# Patient Record
Sex: Male | Born: 1970 | Race: Black or African American | Hispanic: No | Marital: Single | Smoking: Former smoker
Health system: Southern US, Community
[De-identification: ages and names within clinical notes are randomized; demographics above are authoritative.]

## PROBLEM LIST (undated history)

## (undated) DIAGNOSIS — E119 Type 2 diabetes mellitus without complications: Secondary | ICD-10-CM

## (undated) DIAGNOSIS — L0291 Cutaneous abscess, unspecified: Secondary | ICD-10-CM

## (undated) DIAGNOSIS — K219 Gastro-esophageal reflux disease without esophagitis: Secondary | ICD-10-CM

## (undated) DIAGNOSIS — F329 Major depressive disorder, single episode, unspecified: Secondary | ICD-10-CM

## (undated) DIAGNOSIS — F32A Depression, unspecified: Secondary | ICD-10-CM

## (undated) HISTORY — DX: Type 2 diabetes mellitus without complications: E11.9

## (undated) HISTORY — DX: Depression, unspecified: F32.A

## (undated) HISTORY — PX: INCISE AND DRAIN ABCESS: PRO64

---
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
## 2012-02-15 ENCOUNTER — Ambulatory Visit (INDEPENDENT_AMBULATORY_CARE_PROVIDER_SITE_OTHER): Payer: Self-pay | Admitting: Family Medicine

## 2012-02-15 ENCOUNTER — Encounter: Payer: Self-pay | Admitting: Family Medicine

## 2012-02-15 VITALS — BP 132/76 | HR 92 | Temp 97.5°F | Resp 16 | Ht 69.5 in | Wt 252.0 lb

## 2012-02-15 DIAGNOSIS — Z0289 Encounter for other administrative examinations: Secondary | ICD-10-CM

## 2012-02-15 DIAGNOSIS — Z0000 Encounter for general adult medical examination without abnormal findings: Secondary | ICD-10-CM

## 2012-02-15 LAB — POCT URINALYSIS DIPSTICK: Glucose, UA: 0

## 2012-02-15 NOTE — Progress Notes (Signed)
Subjective:    Patient ID: Steven Tanner, male    DOB: 1971-01-04, 41 y.o.   MRN: [id number] Chief Complaint  Patient presents with  . Annual Exam    DOT    HPI  Smokes intermittently - 1 ppd for about half the time of 17 yrs. Does have occ back pain for which he has seen a doctor - told he has degenerative disc disease.  No past medical history on file. No past surgical history on file. Mr. Andujo does not currently have medications on file. No family history on file. History  Substance Use Topics  . Smoking status: Current Every Day Smoker -- 1.0 packs/day    Types: Cigarettes, Cigars    Start date: 02/15/1995  . Smokeless tobacco: Never Used  . Alcohol Use: No   Allergies  Allergen Reactions  . Codeine       Review of Systems  All other systems reviewed and are negative.      BP 132/76  Pulse 92  Temp 97.5 F (36.4 C)  Resp 16  Ht 5' 9.5" (1.765 m)  Wt 252 lb (114.306 kg)  BMI 36.68 kg/m2 Objective:   Physical Exam  Constitutional: He is oriented to person, place, and time. He appears well-developed and well-nourished. No distress.  HENT:  Head: Normocephalic and atraumatic.  Right Ear: Tympanic membrane, external ear and ear canal normal.  Left Ear: Tympanic membrane, external ear and ear canal normal.  Nose: Nose normal.  Mouth/Throat: Uvula is midline, oropharynx is clear and moist and mucous membranes are normal. No oropharyngeal exudate.  Eyes: Conjunctivae are normal. Right eye exhibits no discharge. Left eye exhibits no discharge. No scleral icterus.  Neck: Normal range of motion and full passive range of motion without pain. Neck supple. No thyromegaly present.  Cardiovascular: Normal rate, regular rhythm, normal heart sounds and intact distal pulses.   Pulmonary/Chest: Effort normal and breath sounds normal. No respiratory distress.  Abdominal: Soft. Bowel sounds are normal. He exhibits no distension and no mass. There is no tenderness. There is  no rebound and no guarding. Hernia confirmed negative in the right inguinal area and confirmed negative in the left inguinal area.  Musculoskeletal: He exhibits no edema.       Right shoulder: Normal.       Left shoulder: Normal.       Cervical back: Normal.       Thoracic back: Normal.       Lumbar back: Normal.  Lymphadenopathy:    He has no cervical adenopathy.  Neurological: He is alert and oriented to person, place, and time. He has normal reflexes. No cranial nerve deficit. He exhibits normal muscle tone.  Skin: Skin is warm and dry. No rash noted. He is not diaphoretic. No erythema.  Psychiatric: He has a normal mood and affect. His behavior is normal.      Results for orders placed in visit on 02/15/12  POCT URINALYSIS DIPSTICK      Result Value Range   Color, UA       Clarity, UA       Glucose, UA 0     Bilirubin, UA       Ketones, UA       Spec Grav, UA >=1.030     Blood, UA 0     pH, UA       Protein, UA 100     Urobilinogen, UA       Nitrite, UA  Leukocytes, UA        Assessment & Plan:  DOT exam - No restrictions or concerns. 2 yr card given  Annual physical exam - Plan: POCT urinalysis dipstick  No orders of the defined types were placed in this encounter.

## 2013-09-10 ENCOUNTER — Emergency Department (HOSPITAL_COMMUNITY)
Admission: EM | Admit: 2013-09-10 | Discharge: 2013-09-10 | Disposition: A | Discharge status: Home / Self Care | Payer: 59 | Attending: Emergency Medicine | Admitting: Emergency Medicine

## 2013-09-10 ENCOUNTER — Encounter (HOSPITAL_COMMUNITY): Payer: Self-pay | Admitting: Emergency Medicine

## 2013-09-10 DIAGNOSIS — L0291 Cutaneous abscess, unspecified: Secondary | ICD-10-CM

## 2013-09-10 DIAGNOSIS — L03317 Cellulitis of buttock: Principal | ICD-10-CM

## 2013-09-10 DIAGNOSIS — Z79899 Other long term (current) drug therapy: Secondary | ICD-10-CM | POA: Insufficient documentation

## 2013-09-10 DIAGNOSIS — F17200 Nicotine dependence, unspecified, uncomplicated: Secondary | ICD-10-CM | POA: Insufficient documentation

## 2013-09-10 DIAGNOSIS — L0231 Cutaneous abscess of buttock: Secondary | ICD-10-CM | POA: Insufficient documentation

## 2013-09-10 HISTORY — DX: Cutaneous abscess, unspecified: L02.91

## 2013-09-10 MED ORDER — SULFAMETHOXAZOLE-TRIMETHOPRIM 800-160 MG PO TABS: 2.0000 | ORAL_TABLET | Freq: Two times a day (BID) | ORAL | Status: DC

## 2013-09-10 MED ORDER — HYDROCODONE-ACETAMINOPHEN 5-325 MG PO TABS: 1.0000 | ORAL_TABLET | ORAL | Status: DC | PRN

## 2013-09-10 NOTE — Discharge Instructions (Signed)
Abscess An abscess is an infected area that contains a collection of pus and debris.It can occur in almost any part of the body. An abscess is also known as a furuncle or boil. CAUSES  An abscess occurs when tissue gets infected. This can occur from blockage of oil or sweat glands, infection of hair follicles, or a minor injury to the skin. As the body tries to fight the infection, pus collects in the area and creates pressure under the skin. This pressure causes pain. People with weakened immune systems have difficulty fighting infections and get certain abscesses more often.  SYMPTOMS Usually an abscess develops on the skin and becomes a painful mass that is red, warm, and tender. If the abscess forms under the skin, you may feel a moveable soft area under the skin. Some abscesses break open (rupture) on their own, but most will continue to get worse without care. The infection can spread deeper into the body and eventually into the bloodstream, causing you to feel ill.  DIAGNOSIS  Your caregiver will take your medical history and perform a physical exam. A sample of fluid may also be taken from the abscess to determine what is causing your infection. TREATMENT  Your caregiver may prescribe antibiotic medicines to fight the infection. However, taking antibiotics alone usually does not cure an abscess. Your caregiver may need to make a small cut (incision) in the abscess to drain the pus. In some cases, gauze is packed into the abscess to reduce pain and to continue draining the area. HOME CARE INSTRUCTIONS   Only take over-the-counter or prescription medicines for pain, discomfort, or fever as directed by your caregiver.  If you were prescribed antibiotics, take them as directed. Finish them even if you start to feel better.  If gauze is used, follow your caregiver's directions for changing the gauze.  To avoid spreading the infection:  Keep your draining abscess covered with a  bandage.  Wash your hands well.  Do not share personal care items, towels, or whirlpools with others.  Avoid skin contact with others.  Keep your skin and clothes clean around the abscess.  Keep all follow-up appointments as directed by your caregiver. SEEK MEDICAL CARE IF:   You have increased pain, swelling, redness, fluid drainage, or bleeding.  You have muscle aches, chills, or a general ill feeling.  You have a fever. MAKE SURE YOU:   Understand these instructions.  Will watch your condition.  Will get help right away if you are not doing well or get worse. Document Released: 12/13/2004 Document Revised: 09/04/2011 Document Reviewed: 05/18/2011 [facility] Patient Information 2015 [place], [place]. This information is not intended to replace advice given to you by your health care provider. Make sure you discuss any questions you have with your health care provider.   Continue with your warm tub soaks  - 15 minutes 3 times daily.  Avoid squeezing these sites which can drive the infection deeper.  Return here if soaks and antibiotics are not improving this, as discussed.    You may also benefit from seeing a surgeon if you continue to have recurring problems with abscesses.  You have been given a referral for this.

## 2013-09-10 NOTE — ED Notes (Signed)
Pt requesting to be checked for [facility] spotted fever, states vomiting off and on for past month and lasted vomited 2 weeks ago, states that he has removed 6 attached ticks in the last month as well

## 2013-09-10 NOTE — ED Notes (Signed)
J Idol in with pt

## 2013-09-10 NOTE — ED Notes (Signed)
Pt has 3 rectal abscesses, Has hx of pilonidal cyst.  Has removed ticks recently .  Vomiting over past month.

## 2013-09-10 NOTE — ED Notes (Signed)
Pt with abscess to sacrum for a month, hx of same

## 2013-09-14 NOTE — ED Provider Notes (Signed)
CSN: [id number]     Arrival date & time 09/10/13  1126 History   First MD Initiated Contact with Patient 09/10/13 1151     Chief Complaint  Patient presents with  . Abscess     (Consider location/radiation/quality/duration/timing/severity/associated sxs/prior Treatment) Patient is a 43 y.o. male presenting with abscess. The history is provided by the patient and the spouse.  Abscess Location:  Ano-genital Ano-genital abscess location:  R buttock Size:  1 cm Abscess quality: induration and painful   Abscess quality: not draining and no redness   Red streaking: no   Duration:  1 week (It returned one month ago,  drained, now has returned again) Progression:  Worsening Pain details:    Quality:  Pressure   Severity:  Moderate   Timing:  Constant   Progression:  Worsening Chronicity:  Recurrent Context: not diabetes, not immunosuppression and not injected drug use   Relieved by:  Nothing Worsened by:  Nothing tried Ineffective treatments:  Warm compresses Associated symptoms: no fever, no nausea and no vomiting   Risk factors: prior abscess     Past Medical History  Diagnosis Date  . Abscess    Past Surgical History  Procedure Laterality Date  . Incise and drain abcess     Family History  Problem Relation Age of Onset  . Diabetes Father    History  Substance Use Topics  . Smoking status: Current Every Day Smoker -- 1.00 packs/day    Types: Cigarettes, Cigars    Start date: 02/15/1995  . Smokeless tobacco: Never Used  . Alcohol Use: Yes     Comment: rare    Review of Systems  Constitutional: Negative for fever and chills.  Respiratory: Negative for shortness of breath and wheezing.   Gastrointestinal: Negative for nausea and vomiting.  Skin: Positive for wound.  Neurological: Negative for numbness.      Allergies  Codeine  Home Medications   Prior to Admission medications   Medication Sig Start Date End Date Taking? Authorizing Mareo Portilla   HYDROcodone-acetaminophen (NORCO/VICODIN) 5-325 MG per tablet Take 1 tablet by mouth every 4 (four) hours as needed. 09/10/13   Evalee Jefferson, PA-C  sulfamethoxazole-trimethoprim (SEPTRA DS) 800-160 MG per tablet Take 2 tablets by mouth every 12 (twelve) hours. 09/10/13   Evalee Jefferson, PA-C   BP 131/90  Pulse 101  Temp(Src) 97.5 F (36.4 C) (Oral)  Resp 20  Ht 5' 10.5" (1.791 m)  Wt 260 lb (117.935 kg)  BMI 36.77 kg/m2  SpO2 99% Physical Exam  Constitutional: He appears well-developed and well-nourished. No distress.  HENT:  Head: Normocephalic.  Neck: Neck supple.  Cardiovascular: Normal rate.   Pulmonary/Chest: Effort normal. He has no wheezes.  Musculoskeletal: Normal range of motion. He exhibits no edema.  Skin:  Small indurated abscess, 1 cm without fluctuance or surrounding erythema right medial upper buttock.    ED Course  Procedures (including critical care time) Labs Review Labs Reviewed - No data to display  Imaging Review No results found.   EKG Interpretation None      MDM   Final diagnoses:  Abscess    Discussed I & D vs warm soaks, abx.  Pt opts for trial of abx and warm soaks first.  Advised pt if this does not resolve or if it worsens, may need I & D which he understands.  He was given a referral to gen surgery since this is recurrent at the pilonidal space.  May benefit from more aggressive surgical excision.  Pt understands plan.  The patient appears reasonably screened and/or stabilized for discharge and I doubt any other medical condition or other [facility] [place] [facility] requiring further screening, evaluation, or treatment in the ED at this time prior to discharge.     Evalee Jefferson, PA-C 09/14/13 1426

## 2013-09-19 NOTE — ED Provider Notes (Signed)
Medical screening examination/treatment/procedure(s) were performed by non-physician practitioner and as supervising physician I was immediately available for consultation/collaboration.   EKG Interpretation None        Maudry Diego, MD 09/19/13 [phone]

## 2013-09-24 ENCOUNTER — Emergency Department (HOSPITAL_COMMUNITY)
Admission: EM | Admit: 2013-09-24 | Discharge: 2013-09-24 | Disposition: A | Discharge status: Home / Self Care | Payer: 59 | Attending: Emergency Medicine | Admitting: Emergency Medicine

## 2013-09-24 ENCOUNTER — Encounter (HOSPITAL_COMMUNITY): Payer: Self-pay | Admitting: Emergency Medicine

## 2013-09-24 DIAGNOSIS — L270 Generalized skin eruption due to drugs and medicaments taken internally: Secondary | ICD-10-CM | POA: Insufficient documentation

## 2013-09-24 DIAGNOSIS — F17200 Nicotine dependence, unspecified, uncomplicated: Secondary | ICD-10-CM | POA: Insufficient documentation

## 2013-09-24 DIAGNOSIS — IMO0002 Reserved for concepts with insufficient information to code with codable children: Secondary | ICD-10-CM | POA: Insufficient documentation

## 2013-09-24 MED ORDER — DIPHENHYDRAMINE HCL 50 MG/ML IJ SOLN
50.0000 mg | Freq: Once | INTRAMUSCULAR | Status: AC
  Administered 2013-09-24: 50 mg via INTRAMUSCULAR
  Filled 2013-09-24: qty 1

## 2013-09-24 MED ORDER — PREDNISONE 10 MG PO TABS: ORAL_TABLET | ORAL | Status: DC

## 2013-09-24 MED ORDER — PREDNISONE 50 MG PO TABS
60.0000 mg | ORAL_TABLET | Freq: Once | ORAL | Status: AC
  Administered 2013-09-24: 60 mg via ORAL
  Filled 2013-09-24 (×2): qty 1

## 2013-09-24 MED ORDER — DIPHENHYDRAMINE HCL 25 MG PO TABS: 25.0000 mg | ORAL_TABLET | Freq: Four times a day (QID) | ORAL | Status: DC | PRN

## 2013-09-24 NOTE — ED Notes (Signed)
Pt states he woke up with rash on legs and arms and back, denies having any know contact with any allergens.

## 2013-09-24 NOTE — Discharge Instructions (Signed)
Drug Allergy Allergic reactions to medicines are common. Some allergic reactions are mild. A delayed type of drug allergy that occurs 1 week or more after exposure to a medicine or vaccine is called serum sickness. A life-threatening, sudden (acute) allergic reaction that involves the whole body is called anaphylaxis. CAUSES  "True" drug allergies occur when there is an allergic reaction to a medicine. This is caused by overactivity of the immune system. First, the body becomes sensitized. The immune system is triggered by your first exposure to the medicine. Following this first exposure, future exposure to the same medicine may be life-threatening. Almost any medicine can cause an allergic reaction. Common ones are:  Penicillin.  Sulfonamides (sulfa drugs).  This is the one you have been taking.  Local anesthetics.  X-ray dyes that contain iodine. SYMPTOMS  Common symptoms of a minor allergic reaction are:  Swelling around the mouth.  An itchy red rash or hives.  Vomiting or diarrhea. Anaphylaxis can cause swelling of the mouth and throat. This makes it difficult to breathe and swallow. Severe reactions can be fatal within seconds, even after exposure to only a trace amount of the drug that causes the reaction. HOME CARE INSTRUCTIONS   If you are unsure of what caused your reaction, keep a diary of foods and medicines used. Include the symptoms that followed. Avoid anything that causes reactions.  You may want to follow up with an allergy specialist after the reaction has cleared in order to be tested to confirm the allergy. It is important to confirm that your reaction is an allergy, not just a side effect to the medicine. If you have a true allergy to a medicine, this may prevent that medicine and related medicines from being given to you when you are very ill.  If you have hives or a rash:  Take medicines as directed by your caregiver.  You may use an over-the-counter  antihistamine (diphenhydramine) as needed.  Apply cold compresses to the skin or take baths in cool water. Avoid hot baths or showers.  If you are severely allergic:  Continuous observation after a severe reaction may be needed. Hospitalization is often required.  Wear a medical alert bracelet or necklace stating your allergy.  You and your family must learn how to use an anaphylaxis kit or give an epinephrine injection to temporarily treat an emergency allergic reaction. If you have had a severe reaction, always carry your epinephrine injection or anaphylaxis kit with you. This can be lifesaving if you have a severe reaction.  Do not drive or perform tasks after treatment until the medicines used to treat your reaction have worn off, or until your caregiver says it is okay. SEEK MEDICAL CARE IF:   You think you had an allergic reaction. Symptoms usually start within 30 minutes after exposure.  Symptoms are getting worse rather than better.  You develop new symptoms.  The symptoms that brought you to your caregiver return. SEEK IMMEDIATE MEDICAL CARE IF:   You have swelling of the mouth, difficulty breathing, or wheezing.  You have a tight feeling in your chest or throat.  You develop swelling all over your body, or your mouth, lips, tongue or throat.  You develop severe vomiting or diarrhea.  You feel faint or pass out. This is an emergency.  Call for emergency medical help. MAKE SURE YOU:   Understand these instructions.  Will watch your condition.  Will get help right away if you are not doing well or  get worse. Document Released: 03/05/2005 Document Revised: 05/28/2011 Document Reviewed: 08/09/2010 [facility] Patient Information 2015 [place], [place]. This information is not intended to replace advice given to you by your health care provider. Make sure you discuss any questions you have with your health care provider.   Stop taking your sulfa antibiotic (septra). Take  your next dose of prednisone tomorrow, you may take the entire dose once daily, make sure you take with a snack or food.   Continue using benadryl every 6 hours if you are itching.  Cool compresses or tub soak can help with itching. Hot water/shower will flare your itching.  You should avoid taking any medicines which contain sulfa as I suspect you are allergic to this class of medicine.  This allergy has been added to your medical chart with this hospital, but you should let any other providers know of this reaction.

## 2013-09-26 NOTE — ED Provider Notes (Signed)
CSN: [id number]     Arrival date & time 09/24/13  1507 History   First MD Initiated Contact with Patient 09/24/13 1559     Chief Complaint  Patient presents with  . Rash     (Consider location/radiation/quality/duration/timing/severity/associated sxs/prior Treatment) The history is provided by the patient and the spouse.    Steven Tanner is a 43 y.o. male presenting with a diffuse, itchy rash which started 3 days ago.  He is currently being treated for an abscess on his buttocks which is improved with bactrim.  He does not recognize any other new exposures.  He has been on this medicine since 6/29, but states he misread the instructions and was taking 1 tab bid instead of 2 tabs bid and he increased this dosing Sunday evening, the day before his rash started.  He denies fevers or chills and states his abscess site is much improved.  He continues to complete warm soaks but denies any further drainage from the site.  He has no sob, wheezing, coughing.  His rash is not tender, but simply very itchy without drainage.     Past Medical History  Diagnosis Date  . Abscess    Past Surgical History  Procedure Laterality Date  . Incise and drain abcess     Family History  Problem Relation Age of Onset  . Diabetes Father    History  Substance Use Topics  . Smoking status: Current Every Day Smoker -- 1.00 packs/day    Types: Cigarettes, Cigars    Start date: 02/15/1995  . Smokeless tobacco: Never Used  . Alcohol Use: Yes     Comment: rare    Review of Systems  Constitutional: Negative for fever and chills.  Respiratory: Negative for shortness of breath and wheezing.   Skin: Positive for rash.  Neurological: Negative for numbness.      Allergies  Codeine and Sulfa antibiotics  Home Medications   Prior to Admission medications   Medication Sig Start Date End Date Taking? Authorizing Provider  HYDROcodone-acetaminophen (NORCO/VICODIN) 5-325 MG per tablet Take 1 tablet by  mouth every 4 (four) hours as needed. 09/10/13  Yes Almyra Free Master Touchet, PA-C  sulfamethoxazole-trimethoprim (SEPTRA DS) 800-160 MG per tablet Take 2 tablets by mouth every 12 (twelve) hours. 09/10/13  Yes Evalee Jefferson, PA-C  diphenhydrAMINE (BENADRYL) 25 MG tablet Take 1 tablet (25 mg total) by mouth every 6 (six) hours as needed for itching. 09/24/13   Evalee Jefferson, PA-C  predniSONE (DELTASONE) 10 MG tablet Take 6 tabs daily by mouth for 1 day,  Then 5 tabs daily for 2 days,  4 tabs daily for 2 days,  3 tabs daily for 2 days,  2 tabs daily for 2 days,  Then 1 tab daily for 2 days. 09/24/13   Evalee Jefferson, PA-C   BP 126/74  Pulse 95  Temp(Src) 97.8 F (36.6 C) (Oral)  Resp 16  Ht 5\' 10"  (1.778 m)  Wt 250 lb (113.399 kg)  BMI 35.87 kg/m2  SpO2 100% Physical Exam  Constitutional: He appears well-developed and well-nourished. No distress.  HENT:  Head: Normocephalic.  Neck: Neck supple.  Cardiovascular: Normal rate.   Pulmonary/Chest: Effort normal. He has no wheezes.  Musculoskeletal: Normal range of motion. He exhibits no edema.  Skin: Rash noted. Rash is maculopapular. Rash is not pustular and not vesicular.  Diffuse fine rash with excoriations.  No drainage, no hives.     ED Course  Procedures (including critical care time) Labs Review Labs  Reviewed - No data to display  Imaging Review No results found.   EKG Interpretation None      MDM   Final diagnoses:  Drug rash    Pt advised to stop bactrim,  Has been on this med now for 10 days, abscess improved, continue warm soaks until completely resolved.  Given prednisone taper, benadryl.  Prn f/u.  Advised to avoid sulfa meds in future.  The patient appears reasonably screened and/or stabilized for discharge and I doubt any other medical condition or other [facility] requiring further screening, evaluation, or treatment in the ED at this time prior to discharge.     Evalee Jefferson, PA-C 09/26/13 [phone]

## 2013-09-28 NOTE — ED Provider Notes (Signed)
Medical screening examination/treatment/procedure(s) were performed by non-physician practitioner and as supervising physician I was immediately available for consultation/collaboration.  Leota Jacobsen, MD 09/28/13 1055

## 2013-10-29 ENCOUNTER — Encounter (HOSPITAL_COMMUNITY): Payer: Self-pay | Admitting: Pharmacy Technician

## 2013-10-29 NOTE — Patient Instructions (Signed)
JEMAINE PROKOP  10/29/2013   Your procedure is scheduled on:   11/02/2013  Report to [facility] at  77  AM.  Call this number if you have problems the morning of surgery: [phone]   Remember:   Do not eat food or drink liquids after midnight.   Take these medicines the morning of surgery with A SIP OF WATER: hydrocodone   Do not wear jewelry, make-up or nail polish.  Do not wear lotions, powders, or perfumes.   Do not shave 48 hours prior to surgery. Men may shave face and neck.  Do not bring valuables to the hospital.  [facility] is not responsible for any belongings or valuables.               Contacts, dentures or bridgework may not be worn into surgery.  Leave suitcase in the car. After surgery it may be brought to your room.  For patients admitted to the hospital, discharge time is determined by your treatment team.               Patients discharged the day of surgery will not be allowed to drive home.  Name and phone number of your driver: family  Special Instructions: Shower using CHG 2 nights before surgery and the night before surgery.  If you shower the day of surgery use CHG.  Use special wash - you have one bottle of CHG for all showers.  You should use approximately 1/3 of the bottle for each shower.   Please read over the following fact sheets that you were given: Pain Booklet, Coughing and Deep Breathing, Surgical Site Infection Prevention, Anesthesia Post-op Instructions and Care and Recovery After Surgery Pilonidal Cyst A pilonidal cyst occurs when hairs get trapped (ingrown) beneath the skin in the crease between the buttocks over your sacrum (the bone under that crease). Pilonidal cysts are most common in young men with a lot of body hair. When the cyst is ruptured (breaks) or leaking, fluid from the cyst may cause burning and itching. If the cyst becomes infected, it causes a painful swelling filled with pus (abscess). The pus and trapped hairs need to be  removed (often by lancing) so that the infection can heal. However, recurrence is common and an operation may be needed to remove the cyst. HOME CARE INSTRUCTIONS   If the cyst was NOT INFECTED:  Keep the area clean and dry. Bathe or shower daily. Wash the area well with a germ-killing soap. Warm tub baths may help prevent infection and help with drainage. Dry the area well with a towel.  Avoid tight clothing to keep area as moisture free as possible.  Keep area between buttocks as free of hair as possible. A depilatory may be used.  If the cyst WAS INFECTED and needed to be drained:  Your caregiver packed the wound with gauze to keep the wound open. This allows the wound to heal from the inside outwards and continue draining.  Return for a wound check in 1 day or as suggested.  If you take tub baths or showers, repack the wound with gauze following them. Sponge baths (at the sink) are a good alternative.  If an antibiotic was ordered to fight the infection, take as directed.  Only take over-the-counter or prescription medicines for pain, discomfort, or fever as directed by your caregiver.  After the drain is removed, use sitz baths for 20 minutes 4 times per day. Clean the  wound gently with mild unscented soap, pat dry, and then apply a dry dressing. SEEK MEDICAL CARE IF:   You have increased pain, swelling, redness, drainage, or bleeding from the area.  You have a fever.  You have muscles aches, dizziness, or a general ill feeling. Document Released: 03/02/2000 Document Revised: 05/28/2011 Document Reviewed: 04/30/2008 [facility] Patient Information 2015 [place], [place]. This information is not intended to replace advice given to you by your health care provider. Make sure you discuss any questions you have with your health care provider. PATIENT INSTRUCTIONS POST-ANESTHESIA  IMMEDIATELY FOLLOWING SURGERY:  Do not drive or operate machinery for the first twenty four hours after  surgery.  Do not make any important decisions for twenty four hours after surgery or while taking narcotic pain medications or sedatives.  If you develop intractable nausea and vomiting or a severe headache please notify your doctor immediately.  FOLLOW-UP:  Please make an appointment with your surgeon as instructed. You do not need to follow up with anesthesia unless specifically instructed to do so.  WOUND CARE INSTRUCTIONS (if applicable):  Keep a dry clean dressing on the anesthesia/puncture wound site if there is drainage.  Once the wound has quit draining you may leave it open to air.  Generally you should leave the bandage intact for twenty four hours unless there is drainage.  If the epidural site drains for more than 36-48 hours please call the anesthesia department.  QUESTIONS?:  Please feel free to call your physician or the hospital operator if you have any questions, and they will be happy to assist you.

## 2013-10-30 ENCOUNTER — Encounter (HOSPITAL_COMMUNITY): Payer: Self-pay

## 2013-10-30 ENCOUNTER — Encounter (HOSPITAL_COMMUNITY)
Admission: RE | Admit: 2013-10-30 | Discharge: 2013-10-30 | Disposition: A | Discharge status: Home / Self Care | Payer: 59 | Source: Ambulatory Visit | Attending: General Surgery | Admitting: General Surgery

## 2013-10-30 DIAGNOSIS — L0591 Pilonidal cyst without abscess: Secondary | ICD-10-CM | POA: Diagnosis present

## 2013-10-30 DIAGNOSIS — K219 Gastro-esophageal reflux disease without esophagitis: Secondary | ICD-10-CM | POA: Diagnosis not present

## 2013-10-30 DIAGNOSIS — F17200 Nicotine dependence, unspecified, uncomplicated: Secondary | ICD-10-CM | POA: Diagnosis not present

## 2013-10-30 HISTORY — DX: Gastro-esophageal reflux disease without esophagitis: K21.9

## 2013-10-30 LAB — BASIC METABOLIC PANEL
Anion gap: 13 (ref 5–15)
BUN: 14 mg/dL (ref 6–23)
CHLORIDE: 101 meq/L (ref 96–112)
CO2: 26 mEq/L (ref 19–32)
Calcium: 10 mg/dL (ref 8.4–10.5)
Creatinine, Ser: 1.29 mg/dL (ref 0.50–1.35)
GFR calc non Af Amer: 67 mL/min — ABNORMAL LOW (ref >90)
GFR, EST AFRICAN AMERICAN: 78 mL/min — AB (ref >90)
GLUCOSE: 123 mg/dL — AB (ref 70–99)
POTASSIUM: 5.1 meq/L (ref 3.7–5.3)
Sodium: 140 mEq/L (ref 137–147)

## 2013-10-30 LAB — CBC WITH DIFFERENTIAL/PLATELET
Basophils Absolute: 0 10*3/uL (ref 0.0–0.1)
Basophils Relative: 0 % (ref 0–1)
EOS ABS: 0.2 10*3/uL (ref 0.0–0.7)
Eosinophils Relative: 2 % (ref 0–5)
HCT: 45.9 % (ref 39.0–52.0)
HEMOGLOBIN: 15.4 g/dL (ref 13.0–17.0)
LYMPHS ABS: 2.6 10*3/uL (ref 0.7–4.0)
LYMPHS PCT: 35 % (ref 12–46)
MCH: 28.9 pg (ref 26.0–34.0)
MCHC: 33.6 g/dL (ref 30.0–36.0)
MCV: 86.3 fL (ref 78.0–100.0)
MONOS PCT: 9 % (ref 3–12)
Monocytes Absolute: 0.7 10*3/uL (ref 0.1–1.0)
NEUTROS PCT: 54 % (ref 43–77)
Neutro Abs: 4 10*3/uL (ref 1.7–7.7)
Platelets: 301 10*3/uL (ref 150–400)
RBC: 5.32 MIL/uL (ref 4.22–5.81)
RDW: 14.2 % (ref 11.5–15.5)
WBC: 7.5 10*3/uL (ref 4.0–10.5)

## 2013-10-30 NOTE — Pre-Procedure Instructions (Signed)
Patient given information to sign up for my chart at home. 

## 2013-10-30 NOTE — H&P (Signed)
  NTS SOAP Note  Vital Signs:  Vitals as of: 5/88/5027: Systolic 741: Diastolic 91: Heart Rate 87: Temp 40F: Height 61ft 10.56in: Weight 255Lbs 0 Ounces: Pain Level 4: BMI 36.01  BMI :   Subjective: This 43 Years 2 Months old Male presents for of a recurrent pilonidal cyst.  Has had recurrent swelling in the pilonidal area for some time now.  Has been i and d in the past, never formally excised.  Last episode two weeks ago, has since improved.  Review of Symptoms:  Constitutional:unremarkable   Head:unremarkable    Eyes:unremarkable   Nose/Mouth/Throat:unremarkable Cardiovascular:  unremarkable   Respiratory:unremarkable   Gastrointestinal:  unremarkable   Genitourinary:unremarkable     Musculoskeletal:unremarkable   Hematolgic/Lymphatic:unremarkable     Allergic/Immunologic:unremarkable     Past Medical History:    Reviewed  Past Medical History  Surgical History: unremarkable Medical Problems: none Allergies: nkda Medications: none   Social History:Reviewed  Social History  Preferred Language: English Race:  Black or African American Ethnicity: Not Hispanic / Latino Age: 43 Years 2 Months Marital Status:  S Alcohol: no   Smoking Status: Current every day smoker reviewed on 10/29/2013 Started Date:  Packs per week:  Functional Status reviewed on 10/29/2013 ------------------------------------------------ Bathing: Normal Cooking: Normal Dressing: Normal Driving: Normal Eating: Normal Managing Meds: Normal Oral Care: Normal Shopping: Normal Toileting: Normal Transferring: Normal Walking: Normal Cognitive Status reviewed on 10/29/2013 ------------------------------------------------ Attention: Normal Decision Making: Normal Language: Normal Memory: Normal Motor: Normal Perception: Normal Problem Solving: Normal Visual and Spatial: Normal   Family History:  Reviewed  Family Health History Mother, Living;  Healthy;  Father, Living; Diabetes mellitus, unspecified type;     Objective Information: General:  Well appearing, well nourished in no distress.      Pilonidal cyst present, along with an area of induration just to the left of the pilonidal region. Heart:  RRR, no murmur Lungs:    CTA bilaterally, no wheezes, rhonchi, rales.  Breathing unlabored.  Assessment:Pilonidal cyst  Diagnoses: 287.1 Cyst - pilonidal (Pilonidal cyst without abscess)  Procedures: 86767 - OFFICE OUTPATIENT NEW 30 MINUTES    Plan:  Scheduled for pilonidal cyst excision on 11/02/13.   Patient Education:Alternative treatments to surgery were discussed with patient (and family).  Risks and benefits  of procedure including bleeding, infection, and recurrence of the cyst were fully explained to the patient (and family) who gave informed consent. Patient/family questions were addressed.  Follow-up:Pending Surgery

## 2013-11-02 ENCOUNTER — Ambulatory Visit (HOSPITAL_COMMUNITY)
Admission: RE | Admit: 2013-11-02 | Discharge: 2013-11-02 | Disposition: A | Discharge status: Home / Self Care | Payer: 59 | Source: Ambulatory Visit | Attending: General Surgery | Admitting: General Surgery

## 2013-11-02 ENCOUNTER — Encounter (HOSPITAL_COMMUNITY): Payer: 59 | Admitting: Anesthesiology

## 2013-11-02 ENCOUNTER — Encounter (HOSPITAL_COMMUNITY)
Admission: RE | Disposition: A | Discharge status: Home / Self Care | Payer: Self-pay | Source: Ambulatory Visit | Attending: General Surgery

## 2013-11-02 ENCOUNTER — Ambulatory Visit (HOSPITAL_COMMUNITY): Payer: 59 | Admitting: Anesthesiology

## 2013-11-02 ENCOUNTER — Encounter (HOSPITAL_COMMUNITY): Payer: Self-pay | Admitting: *Deleted

## 2013-11-02 DIAGNOSIS — K219 Gastro-esophageal reflux disease without esophagitis: Secondary | ICD-10-CM | POA: Insufficient documentation

## 2013-11-02 DIAGNOSIS — L0591 Pilonidal cyst without abscess: Secondary | ICD-10-CM | POA: Insufficient documentation

## 2013-11-02 DIAGNOSIS — F17200 Nicotine dependence, unspecified, uncomplicated: Secondary | ICD-10-CM | POA: Insufficient documentation

## 2013-11-02 HISTORY — PX: PILONIDAL CYST EXCISION: SHX744

## 2013-11-02 SURGERY — EXCISION, SIMPLE PILONIDAL CYST
Anesthesia: General

## 2013-11-02 MED ORDER — DEXAMETHASONE SODIUM PHOSPHATE 4 MG/ML IJ SOLN
INTRAMUSCULAR | Status: AC
  Filled 2013-11-02: qty 1

## 2013-11-02 MED ORDER — GLYCOPYRROLATE 0.2 MG/ML IJ SOLN
0.2000 mg | Freq: Once | INTRAMUSCULAR | Status: AC
  Administered 2013-11-02: 0.2 mg via INTRAVENOUS

## 2013-11-02 MED ORDER — CEFAZOLIN SODIUM-DEXTROSE 2-3 GM-% IV SOLR
2.0000 g | INTRAVENOUS | Status: AC
  Administered 2013-11-02: 2 g via INTRAVENOUS

## 2013-11-02 MED ORDER — GLYCOPYRROLATE 0.2 MG/ML IJ SOLN
INTRAMUSCULAR | Status: AC
  Filled 2013-11-02: qty 2

## 2013-11-02 MED ORDER — CEFAZOLIN SODIUM-DEXTROSE 2-3 GM-% IV SOLR
INTRAVENOUS | Status: AC
  Filled 2013-11-02: qty 50

## 2013-11-02 MED ORDER — LIDOCAINE HCL (PF) 1 % IJ SOLN
INTRAMUSCULAR | Status: AC
  Filled 2013-11-02: qty 5

## 2013-11-02 MED ORDER — ONDANSETRON HCL 4 MG/2ML IJ SOLN
4.0000 mg | Freq: Once | INTRAMUSCULAR | Status: AC
  Administered 2013-11-02: 4 mg via INTRAVENOUS

## 2013-11-02 MED ORDER — SUCCINYLCHOLINE CHLORIDE 20 MG/ML IJ SOLN
INTRAMUSCULAR | Status: AC
  Filled 2013-11-02: qty 1

## 2013-11-02 MED ORDER — ONDANSETRON HCL 4 MG/2ML IJ SOLN
INTRAMUSCULAR | Status: AC
  Filled 2013-11-02: qty 2

## 2013-11-02 MED ORDER — ONDANSETRON HCL 4 MG/2ML IJ SOLN: 4.0000 mg | Freq: Once | INTRAMUSCULAR | Status: DC | PRN

## 2013-11-02 MED ORDER — SODIUM CHLORIDE 0.9 % IR SOLN
Status: DC | PRN
  Administered 2013-11-02: 1000 mL

## 2013-11-02 MED ORDER — MIDAZOLAM HCL 2 MG/2ML IJ SOLN
1.0000 mg | INTRAMUSCULAR | Status: DC | PRN
  Administered 2013-11-02: 2 mg via INTRAVENOUS

## 2013-11-02 MED ORDER — PROPOFOL 10 MG/ML IV EMUL
INTRAVENOUS | Status: AC
  Filled 2013-11-02: qty 20

## 2013-11-02 MED ORDER — ROCURONIUM BROMIDE 50 MG/5ML IV SOLN
INTRAVENOUS | Status: AC
  Filled 2013-11-02: qty 1

## 2013-11-02 MED ORDER — FENTANYL CITRATE 0.05 MG/ML IJ SOLN
25.0000 ug | INTRAMUSCULAR | Status: DC | PRN
  Administered 2013-11-02: 50 ug via INTRAVENOUS
  Filled 2013-11-02: qty 2

## 2013-11-02 MED ORDER — GLYCOPYRROLATE 0.2 MG/ML IJ SOLN
INTRAMUSCULAR | Status: DC | PRN
  Administered 2013-11-02: 0.6 mg via INTRAVENOUS

## 2013-11-02 MED ORDER — GLYCOPYRROLATE 0.2 MG/ML IJ SOLN
INTRAMUSCULAR | Status: AC
  Filled 2013-11-02: qty 1

## 2013-11-02 MED ORDER — HYDROCODONE-ACETAMINOPHEN 5-325 MG PO TABS: 1.0000 | ORAL_TABLET | ORAL | Status: DC | PRN

## 2013-11-02 MED ORDER — BUPIVACAINE HCL 0.5 % IJ SOLN
INTRAMUSCULAR | Status: DC | PRN
  Administered 2013-11-02: 4 mL

## 2013-11-02 MED ORDER — LIDOCAINE HCL (PF) 1 % IJ SOLN
INTRAMUSCULAR | Status: AC
  Filled 2013-11-02: qty 30

## 2013-11-02 MED ORDER — LIDOCAINE HCL (CARDIAC) 10 MG/ML IV SOLN
INTRAVENOUS | Status: DC | PRN
  Administered 2013-11-02: 20 mg via INTRAVENOUS

## 2013-11-02 MED ORDER — POVIDONE-IODINE 10 % OINT PACKET
TOPICAL_OINTMENT | CUTANEOUS | Status: DC | PRN
  Administered 2013-11-02: 1 via TOPICAL

## 2013-11-02 MED ORDER — NEOSTIGMINE METHYLSULFATE 10 MG/10ML IV SOLN
INTRAVENOUS | Status: DC | PRN
  Administered 2013-11-02: 3 mg via INTRAVENOUS

## 2013-11-02 MED ORDER — DEXAMETHASONE SODIUM PHOSPHATE 4 MG/ML IJ SOLN
4.0000 mg | Freq: Once | INTRAMUSCULAR | Status: AC
Start: 2013-11-02 — End: 2013-11-02
  Administered 2013-11-02: 4 mg via INTRAVENOUS

## 2013-11-02 MED ORDER — MIDAZOLAM HCL 2 MG/2ML IJ SOLN
INTRAMUSCULAR | Status: AC
  Filled 2013-11-02: qty 2

## 2013-11-02 MED ORDER — FENTANYL CITRATE 0.05 MG/ML IJ SOLN
INTRAMUSCULAR | Status: DC | PRN
  Administered 2013-11-02 (×2): 50 ug via INTRAVENOUS

## 2013-11-02 MED ORDER — KETOROLAC TROMETHAMINE 30 MG/ML IJ SOLN
30.0000 mg | Freq: Once | INTRAMUSCULAR | Status: AC
  Administered 2013-11-02: 30 mg via INTRAVENOUS
  Filled 2013-11-02: qty 1

## 2013-11-02 MED ORDER — PROPOFOL 10 MG/ML IV BOLUS
INTRAVENOUS | Status: DC | PRN
  Administered 2013-11-02: 200 mg via INTRAVENOUS
  Administered 2013-11-02 (×2): 30 mg via INTRAVENOUS
  Administered 2013-11-02: 40 mg via INTRAVENOUS
  Administered 2013-11-02: 30 mg via INTRAVENOUS

## 2013-11-02 MED ORDER — ROCURONIUM BROMIDE 100 MG/10ML IV SOLN
INTRAVENOUS | Status: DC | PRN
  Administered 2013-11-02: 5 mg via INTRAVENOUS
  Administered 2013-11-02: 25 mg via INTRAVENOUS

## 2013-11-02 MED ORDER — BUPIVACAINE HCL (PF) 0.5 % IJ SOLN
INTRAMUSCULAR | Status: AC
  Filled 2013-11-02: qty 30

## 2013-11-02 MED ORDER — CHLORHEXIDINE GLUCONATE 4 % EX LIQD: 1.0000 "application " | Freq: Once | CUTANEOUS | Status: DC

## 2013-11-02 MED ORDER — POVIDONE-IODINE 10 % EX OINT
TOPICAL_OINTMENT | CUTANEOUS | Status: AC
  Filled 2013-11-02: qty 1

## 2013-11-02 MED ORDER — LACTATED RINGERS IV SOLN
INTRAVENOUS | Status: DC
  Administered 2013-11-02: 09:00:00 via INTRAVENOUS

## 2013-11-02 MED ORDER — FENTANYL CITRATE 0.05 MG/ML IJ SOLN
INTRAMUSCULAR | Status: AC
  Filled 2013-11-02: qty 2

## 2013-11-02 MED ORDER — GLYCOPYRROLATE 0.2 MG/ML IJ SOLN
INTRAMUSCULAR | Status: AC
  Filled 2013-11-02: qty 3

## 2013-11-02 SURGICAL SUPPLY — 31 items
BAG HAMPER (MISCELLANEOUS) ×3 IMPLANT
BLADE SURG 15 STRL LF DISP TIS (BLADE) ×2 IMPLANT
BLADE SURG 15 STRL SS (BLADE) ×4
CLOTH BEACON ORANGE TIMEOUT ST (SAFETY) ×3 IMPLANT
COVER LIGHT HANDLE STERIS (MISCELLANEOUS) ×6 IMPLANT
ELECT REM PT RETURN 9FT ADLT (ELECTROSURGICAL) ×3
ELECTRODE REM PT RTRN 9FT ADLT (ELECTROSURGICAL) ×1 IMPLANT
FORMALIN 10 PREFIL 120ML (MISCELLANEOUS) ×3 IMPLANT
GAUZE SPONGE 4X4 12PLY STRL (GAUZE/BANDAGES/DRESSINGS) ×3 IMPLANT
GAUZE XEROFORM 5X9 LF (GAUZE/BANDAGES/DRESSINGS) IMPLANT
GLOVE BIOGEL M 6.5 STRL (GLOVE) ×3 IMPLANT
GLOVE BIOGEL PI IND STRL 7.0 (GLOVE) ×1 IMPLANT
GLOVE BIOGEL PI INDICATOR 7.0 (GLOVE) ×2
GLOVE EXAM NITRILE MD LF STRL (GLOVE) ×3 IMPLANT
GLOVE SURG SS PI 7.5 STRL IVOR (GLOVE) ×3 IMPLANT
GOWN STRL REUS W/TWL LRG LVL3 (GOWN DISPOSABLE) ×6 IMPLANT
KIT ROOM TURNOVER APOR (KITS) ×3 IMPLANT
MANIFOLD NEPTUNE II (INSTRUMENTS) ×3 IMPLANT
NEEDLE HYPO 25X1 1.5 SAFETY (NEEDLE) ×3 IMPLANT
NS IRRIG 1000ML POUR BTL (IV SOLUTION) ×3 IMPLANT
PACK MINOR (CUSTOM PROCEDURE TRAY) ×3 IMPLANT
PAD ABD 5X9 TENDERSORB (GAUZE/BANDAGES/DRESSINGS) IMPLANT
PAD ARMBOARD 7.5X6 YLW CONV (MISCELLANEOUS) ×3 IMPLANT
SET BASIN LINEN APH (SET/KITS/TRAYS/PACK) ×3 IMPLANT
SOL PREP PROV IODINE SCRUB 4OZ (MISCELLANEOUS) ×3 IMPLANT
SPONGE LAP 18X18 X RAY DECT (DISPOSABLE) ×3 IMPLANT
SUT PROLENE 3 0 PS 2 (SUTURE) ×9 IMPLANT
SYR BULB IRRIGATION 50ML (SYRINGE) ×3 IMPLANT
SYRINGE CONTROL L 12CC (SYRINGE) ×3 IMPLANT
TAPE CLOTH SURG 4X10 WHT LF (GAUZE/BANDAGES/DRESSINGS) ×3 IMPLANT
TOWEL OR 17X26 4PK STRL BLUE (TOWEL DISPOSABLE) IMPLANT

## 2013-11-02 NOTE — Discharge Instructions (Signed)
°

## 2013-11-02 NOTE — Anesthesia Procedure Notes (Signed)
Procedure Name: Intubation Date/Time: 11/02/2013 9:39 AM Performed by: Vista Deck Pre-anesthesia Checklist: Patient identified, Patient being monitored, Timeout performed, Emergency Drugs available and Suction available Patient Re-evaluated:Patient Re-evaluated prior to inductionOxygen Delivery Method: Circle System Utilized Preoxygenation: Pre-oxygenation with 100% oxygen Intubation Type: IV induction, Rapid sequence and Cricoid Pressure applied Ventilation: Mask ventilation without difficulty Laryngoscope Size: Miller and 2 Grade View: Grade I Tube type: Oral Tube size: 8.0 mm Number of attempts: 1 Airway Equipment and Method: stylet and Oral airway Placement Confirmation: ETT inserted through vocal cords under direct vision,  positive ETCO2 and breath sounds checked- equal and bilateral Secured at: 21 cm Tube secured with: Tape Dental Injury: Teeth and Oropharynx as per pre-operative assessment

## 2013-11-02 NOTE — Op Note (Signed)
Patient:  FENDER HERDER  DOB:  [birth year]  MRN:  [id number]   Preop Diagnosis:  Pilonidal cyst  Postop Diagnosis:  Same  Procedure:  Excision of pilonidal cyst  Surgeon:  Aviva Signs, M.D.  Anes:  General  Indications:  Patient is a 43 year old black male who presents with recurrent episodes of inflammation and infection of the hilum lateral cyst. The risks and benefits of the procedure including bleeding, infection, recurrence of the cyst, and wound breakdown were fully explained to the patient, who gave informed consent.  Procedure note:  The patient was placed the left lateral decubitus position after induction of general endotracheal anesthesia. The pilonidal region was prepped and draped using usual sterile technique with Betadine. Surgical site confirmation was performed.  Just to the left of the midline, indurated tissue and a sinus track was noted. The area of involvement was about 3 cm in length. An elliptical incision was made down to the subcutaneous tissue. Granulomatous tissue was noted at the base of the tailbone and this was curetted and excised without difficulty. The wound was irrigated normal saline. 0.5% Sensorcaine was instilled the surrounding wound. The wound is closed primarily using 3-0 Prolene interrupted sutures. Betadine ointment and dry sterile dressing were applied.  All tape and needle counts were correct at the end of the procedure. Patient was awakened and transferred to PACU in stable condition.  Complications:  None  EBL:  Minimal  Specimen:  Pilonidal cyst

## 2013-11-02 NOTE — Transfer of Care (Signed)
Immediate Anesthesia Transfer of Care Note  Patient: Steven Tanner  Procedure(s) Performed: Procedure(s): CYST EXCISION PILONIDAL SIMPLE (N/A)  Patient Location: PACU  Anesthesia Type:General  Level of Consciousness: awake and patient cooperative  Airway & Oxygen Therapy: Patient Spontanous Breathing  Post-op Assessment: Report given to PACU RN, Post -op Vital signs reviewed and stable and Patient moving all extremities  Post vital signs: Reviewed and stable  Complications: No apparent anesthesia complications

## 2013-11-02 NOTE — Interval H&P Note (Signed)
History and Physical Interval Note:  11/02/2013 9:06 AM  Steven Tanner  has presented today for surgery, with the diagnosis of pilonidal cyst without abscess  The various methods of treatment have been discussed with the patient and family. After consideration of risks, benefits and other options for treatment, the patient has consented to  Procedure(s): CYST EXCISION PILONIDAL SIMPLE (N/A) as a surgical intervention .  The patient's history has been reviewed, patient examined, no change in status, stable for surgery.  I have reviewed the patient's chart and labs.  Questions were answered to the patient's satisfaction.     Aviva Signs A

## 2013-11-02 NOTE — Anesthesia Preprocedure Evaluation (Signed)
Anesthesia Evaluation  Patient identified by MRN, date of birth, ID band Patient awake    Reviewed: Allergy & Precautions, H&P , NPO status , Patient's Chart, lab work & pertinent test results  Airway Mallampati: II TM Distance: >3 FB     Dental  (+) Teeth Intact   Pulmonary Current Smoker,  breath sounds clear to auscultation        Cardiovascular negative cardio ROS  Rhythm:Regular Rate:Normal     Neuro/Psych    GI/Hepatic GERD-  Poorly Controlled,(+)     substance abuse  marijuana use,   Endo/Other    Renal/GU      Musculoskeletal   Abdominal   Peds  Hematology   Anesthesia Other Findings   Reproductive/Obstetrics                           Anesthesia Physical Anesthesia Plan  ASA: II  Anesthesia Plan: General   Post-op Pain Management:    Induction: Intravenous, Rapid sequence and Cricoid pressure planned  Airway Management Planned: Oral ETT  Additional Equipment:   Intra-op Plan:   Post-operative Plan: Extubation in OR  Informed Consent: I have reviewed the patients History and Physical, chart, labs and discussed the procedure including the risks, benefits and alternatives for the proposed anesthesia with the patient or authorized representative who has indicated his/her understanding and acceptance.     Plan Discussed with:   Anesthesia Plan Comments: (Lateral positioning.)        Anesthesia Quick Evaluation

## 2013-11-02 NOTE — Anesthesia Postprocedure Evaluation (Signed)
  Anesthesia Post-op Note  Patient: Steven Tanner  Procedure(s) Performed: Procedure(s): CYST EXCISION PILONIDAL SIMPLE (N/A)  Patient Location: PACU  Anesthesia Type:General  Level of Consciousness: awake and patient cooperative  Airway and Oxygen Therapy: Patient Spontanous Breathing  Post-op Pain: mild  Post-op Assessment: Post-op Vital signs reviewed, Patient's Cardiovascular Status Stable, Respiratory Function Stable, Patent Airway and No signs of Nausea or vomiting  Post-op Vital Signs: Reviewed and stable  Last Vitals:  Filed Vitals:   11/02/13 1030  BP: 116/62  Pulse: 97  Temp:   Resp: 25    Complications: No apparent anesthesia complications

## 2013-11-04 ENCOUNTER — Encounter (HOSPITAL_COMMUNITY): Payer: Self-pay | Admitting: General Surgery

## 2014-03-15 ENCOUNTER — Telehealth: Payer: Self-pay | Admitting: Family Medicine

## 2014-03-16 ENCOUNTER — Telehealth: Payer: Self-pay | Admitting: Family Medicine

## 2014-03-16 NOTE — Telephone Encounter (Signed)
Call was handled in another encounter.

## 2014-03-16 NOTE — Telephone Encounter (Signed)
Patient advised that he needs to be evaluated for the passing blood in his stool but that our 1st available appointment is not until March for new patient and that he is advised to go to urgent care or ER if unable to get in somewhere today.

## 2014-04-02 ENCOUNTER — Ambulatory Visit (INDEPENDENT_AMBULATORY_CARE_PROVIDER_SITE_OTHER): Payer: Self-pay | Admitting: Family Medicine

## 2014-04-02 VITALS — BP 136/92 | HR 91 | Temp 98.2°F | Resp 18 | Ht 69.75 in | Wt 258.2 lb

## 2014-04-02 DIAGNOSIS — Z024 Encounter for examination for driving license: Secondary | ICD-10-CM

## 2014-04-02 DIAGNOSIS — Z029 Encounter for administrative examinations, unspecified: Secondary | ICD-10-CM

## 2014-04-02 NOTE — Progress Notes (Signed)
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  Past medical history: Gen. he is been healthy. Not on any regular medications. Has had no major illnesses. Pilonidal cyst was his only surgery No medication allergies  Social history: He has a girlfriend. She does report he snores. He does smoke. Rarely drinks. Does not use drugs.  Family history: Family history of diabetes in his father. His mother is healthy.  Review of systems: Essentially unremarkable  Physical exam: Overweight male in no major distress. TMs normal. Eyes PERRLA. Fundi benign. Throat clear. Teeth have some problems and continue some dental work. Neck supple. Neck measures 17-3/4 inches. Chest clear. Heart regular without murmurs. Evidence of mass or tenderness. Normal male external genitalia. Testes descended with no hernias. Extremities unremarkable. Skin unremarkable.  Assessment:: Normal DOT examination History of snoring, would advise some time getting a sleep apnea test though he does not sound like he has major problems at this time Obesity Tobacco use disorder Borderline blood pressure elevation Family history diabetes  Plan: Weight loss, exercise, get off cigarettes. Advise sleeps evaluation to be considered by his PCP sometime.  2 year card

## 2014-04-02 NOTE — Patient Instructions (Signed)
Work hard on weight loss  Think seriously about picking a quit date and getting off of the cigarettes  Get regular physical exercise  I would advise you get a physical examination some time that includes some general laboratory evaluation and that you have your doctor order a sleep study. The next time that you have a DOT examination you should bring a sleep study report with you.  Return if we can be of assistance

## 2016-02-28 ENCOUNTER — Encounter (HOSPITAL_COMMUNITY): Payer: Self-pay | Admitting: Emergency Medicine

## 2016-02-28 ENCOUNTER — Emergency Department (HOSPITAL_COMMUNITY): Payer: No Typology Code available for payment source

## 2016-02-28 ENCOUNTER — Emergency Department (HOSPITAL_COMMUNITY)
Admission: EM | Admit: 2016-02-28 | Discharge: 2016-02-28 | Disposition: A | Discharge status: Home / Self Care | Payer: No Typology Code available for payment source | Attending: Emergency Medicine | Admitting: Emergency Medicine

## 2016-02-28 DIAGNOSIS — S39012A Strain of muscle, fascia and tendon of lower back, initial encounter: Secondary | ICD-10-CM

## 2016-02-28 DIAGNOSIS — Y9389 Activity, other specified: Secondary | ICD-10-CM | POA: Insufficient documentation

## 2016-02-28 DIAGNOSIS — F17290 Nicotine dependence, other tobacco product, uncomplicated: Secondary | ICD-10-CM | POA: Diagnosis not present

## 2016-02-28 DIAGNOSIS — F17210 Nicotine dependence, cigarettes, uncomplicated: Secondary | ICD-10-CM | POA: Insufficient documentation

## 2016-02-28 DIAGNOSIS — Y999 Unspecified external cause status: Secondary | ICD-10-CM | POA: Diagnosis not present

## 2016-02-28 DIAGNOSIS — S3992XA Unspecified injury of lower back, initial encounter: Secondary | ICD-10-CM | POA: Diagnosis present

## 2016-02-28 DIAGNOSIS — Y92410 Unspecified street and highway as the place of occurrence of the external cause: Secondary | ICD-10-CM | POA: Diagnosis not present

## 2016-02-28 MED ORDER — HYDROCODONE-ACETAMINOPHEN 5-325 MG PO TABS: ORAL_TABLET | ORAL | 0 refills | Status: DC

## 2016-02-28 MED ORDER — IBUPROFEN 800 MG PO TABS: 800.0000 mg | ORAL_TABLET | Freq: Three times a day (TID) | ORAL | 0 refills | Status: DC

## 2016-02-28 MED ORDER — CYCLOBENZAPRINE HCL 10 MG PO TABS: 10.0000 mg | ORAL_TABLET | Freq: Three times a day (TID) | ORAL | 0 refills | Status: DC | PRN

## 2016-02-28 NOTE — Discharge Instructions (Signed)
Alternate ice and heat to your back.  Follow-up with the orthopedic doctor listed in a few days if the pain is not improving
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
## 2016-03-21 ENCOUNTER — Ambulatory Visit (INDEPENDENT_AMBULATORY_CARE_PROVIDER_SITE_OTHER): Payer: Self-pay | Admitting: Orthopaedic Surgery

## 2016-03-21 ENCOUNTER — Encounter: Payer: Self-pay | Admitting: Orthopaedic Surgery

## 2016-03-21 VITALS — BP 121/81 | HR 113 | Temp 97.7°F | Ht 72.0 in | Wt 277.0 lb

## 2016-03-21 DIAGNOSIS — M545 Low back pain, unspecified: Secondary | ICD-10-CM
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
## 2016-03-21 NOTE — Patient Instructions (Addendum)
Steps to Quit Smoking Smoking tobacco can be bad for your health. It can also affect almost every organ in your body. Smoking puts you and people around you at risk for many serious long-lasting (chronic) diseases. Quitting smoking is hard, but it is one of the best things that you can do for your health. It is never too late to quit. What are the benefits of quitting smoking? When you quit smoking, you lower your risk for getting serious diseases and conditions. They can include:  Lung cancer or lung disease.  Heart disease.  Stroke.  Heart attack.  Not being able to have children (infertility).  Weak bones (osteoporosis) and broken bones (fractures). If you have coughing, wheezing, and shortness of breath, those symptoms may get better when you quit. You may also get sick less often. If you are pregnant, quitting smoking can help to lower your chances of having a baby of low birth weight. What can I do to help me quit smoking? Talk with your doctor about what can help you quit smoking. Some things you can do (strategies) include:  Quitting smoking totally, instead of slowly cutting back how much you smoke over a period of time.  Going to in-person counseling. You are more likely to quit if you go to many counseling sessions.  Using resources and support systems, such as:  Online chats with a counselor.  Phone quitlines.  Printed self-help materials.  Support groups or group counseling.  Text messaging programs.  Mobile phone apps or applications.  Taking medicines. Some of these medicines may have nicotine in them. If you are pregnant or breastfeeding, do not take any medicines to quit smoking unless your doctor says it is okay. Talk with your doctor about counseling or other things that can help you. Talk with your doctor about using more than one strategy at the same time, such as taking medicines while you are also going to in-person counseling. This can help make quitting  easier. What things can I do to make it easier to quit? Quitting smoking might feel very hard at first, but there is a lot that you can do to make it easier. Take these steps:  Talk to your family and friends. Ask them to support and encourage you.  Call phone quitlines, reach out to support groups, or work with a counselor.  Ask people who smoke to not smoke around you.  Avoid places that make you want (trigger) to smoke, such as:  Bars.  Parties.  Smoke-break areas at work.  Spend time with people who do not smoke.  Lower the stress in your life. Stress can make you want to smoke. Try these things to help your stress:  Getting regular exercise.  Deep-breathing exercises.  Yoga.  Meditating.  Doing a body scan. To do this, close your eyes, focus on one area of your body at a time from head to toe, and notice which parts of your body are tense. Try to relax the muscles in those areas.  Download or buy apps on your mobile phone or tablet that can help you stick to your quit plan. There are many free apps, such as QuitGuide from the CDC (Centers for Disease Control and Prevention). You can find more support from smokefree.gov and other websites. This information is not intended to replace advice given to you by your health care provider. Make sure you discuss any questions you have with your health care provider. Document Released: 12/30/2008 Document Revised: 11/01/2015 Document   Reviewed: 07/20/2014 Elsevier Interactive Patient Education  2017 [employer].  Back Exercises Introduction If you have pain in your back, do these exercises 2-3 times each day or as told by your doctor. When the pain goes away, do the exercises once each day, but repeat the steps more times for each exercise (do more repetitions). If you do not have pain in your back, do these exercises once each day or as told by your doctor. Exercises Single Knee to Chest  Do these steps 3-5 times in a row for  each leg: 1. Lie on your back on a firm bed or the floor with your legs stretched out. 2. Bring one knee to your chest. 3. Hold your knee to your chest by grabbing your knee or thigh. 4. Pull on your knee until you feel a gentle stretch in your lower back. 5. Keep doing the stretch for 10-30 seconds. 6. Slowly let go of your leg and straighten it. Pelvic Tilt  Do these steps 5-10 times in a row: 1. Lie on your back on a firm bed or the floor with your legs stretched out. 2. Bend your knees so they point up to the ceiling. Your feet should be flat on the floor. 3. Tighten your lower belly (abdomen) muscles to press your lower back against the floor. This will make your tailbone point up to the ceiling instead of pointing down to your feet or the floor. 4. Stay in this position for 5-10 seconds while you gently tighten your muscles and breathe evenly. Cat-Cow  Do these steps until your lower back bends more easily: 1. Get on your hands and knees on a firm surface. Keep your hands under your shoulders, and keep your knees under your hips. You may put padding under your knees. 2. Let your head hang down, and make your tailbone point down to the floor so your lower back is round like the back of a cat. 3. Stay in this position for 5 seconds. 4. Slowly lift your head and make your tailbone point up to the ceiling so your back hangs low (sags) like the back of a cow. 5. Stay in this position for 5 seconds. Press-Ups  Do these steps 5-10 times in a row: 1. Lie on your belly (face-down) on the floor. 2. Place your hands near your head, about shoulder-width apart. 3. While you keep your back relaxed and keep your hips on the floor, slowly straighten your arms to raise the top half of your body and lift your shoulders. Do not use your back muscles. To make yourself more comfortable, you may change where you place your hands. 4. Stay in this position for 5 seconds. 5. Slowly return to lying flat on the  floor. Bridges  Do these steps 10 times in a row: 1. Lie on your back on a firm surface. 2. Bend your knees so they point up to the ceiling. Your feet should be flat on the floor. 3. Tighten your butt muscles and lift your butt off of the floor until your waist is almost as high as your knees. If you do not feel the muscles working in your butt and the back of your thighs, slide your feet 1-2 inches farther away from your butt. 4. Stay in this position for 3-5 seconds. 5. Slowly lower your butt to the floor, and let your butt muscles relax. If this exercise is too easy, try doing it with your arms crossed over your chest. Belly  Crunches  Do these steps 5-10 times in a row: 1. Lie on your back on a firm bed or the floor with your legs stretched out. 2. Bend your knees so they point up to the ceiling. Your feet should be flat on the floor. 3. Cross your arms over your chest. 4. Tip your chin a little bit toward your chest but do not bend your neck. 5. Tighten your belly muscles and slowly raise your chest just enough to lift your shoulder blades a tiny bit off of the floor. 6. Slowly lower your chest and your head to the floor. Back Lifts  Do these steps 5-10 times in a row: 1. Lie on your belly (face-down) with your arms at your sides, and rest your forehead on the floor. 2. Tighten the muscles in your legs and your butt. 3. Slowly lift your chest off of the floor while you keep your hips on the floor. Keep the back of your head in line with the curve in your back. Look at the floor while you do this. 4. Stay in this position for 3-5 seconds. 5. Slowly lower your chest and your face to the floor. Contact a doctor if:  Your back pain gets a lot worse when you do an exercise.  Your back pain does not lessen 2 hours after you exercise. If you have any of these problems, stop doing the exercises. Do not do them again unless your doctor says it is okay. Get help right away if:  You have  sudden, very bad back pain. If this happens, stop doing the exercises. Do not do them again unless your doctor says it is okay. This information is not intended to replace advice given to you by your health care provider. Make sure you discuss any questions you have with your health care provider. Document Released: 04/07/2010 Document Revised: 08/11/2015 Document Reviewed: 04/29/2014  2017 Elsevier

## 2016-03-29 ENCOUNTER — Encounter: Payer: Self-pay | Admitting: Orthopaedic Surgery

## 2016-04-04 ENCOUNTER — Ambulatory Visit: Payer: Self-pay | Admitting: Orthopaedic Surgery

## 2016-04-10 ENCOUNTER — Encounter (HOSPITAL_COMMUNITY): Payer: Self-pay | Admitting: Emergency Medicine

## 2016-04-10 ENCOUNTER — Ambulatory Visit (INDEPENDENT_AMBULATORY_CARE_PROVIDER_SITE_OTHER): Payer: Self-pay | Admitting: Orthopaedic Surgery

## 2016-04-10 ENCOUNTER — Encounter: Payer: Self-pay | Admitting: Orthopaedic Surgery

## 2016-04-10 ENCOUNTER — Emergency Department (HOSPITAL_COMMUNITY)
Admission: EM | Admit: 2016-04-10 | Discharge: 2016-04-10 | Disposition: A | Discharge status: Home / Self Care | Payer: Self-pay | Attending: Emergency Medicine | Admitting: Emergency Medicine

## 2016-04-10 ENCOUNTER — Emergency Department (HOSPITAL_COMMUNITY): Payer: Self-pay

## 2016-04-10 VITALS — BP 139/91 | HR 121 | Temp 97.7°F | Ht 72.0 in | Wt 271.0 lb

## 2016-04-10 DIAGNOSIS — F17210 Nicotine dependence, cigarettes, uncomplicated: Secondary | ICD-10-CM | POA: Insufficient documentation

## 2016-04-10 DIAGNOSIS — L03012 Cellulitis of left finger: Secondary | ICD-10-CM | POA: Insufficient documentation

## 2016-04-10 DIAGNOSIS — M545 Low back pain, unspecified: Secondary | ICD-10-CM

## 2016-04-10 MED ORDER — CEPHALEXIN 500 MG PO CAPS
500.0000 mg | ORAL_CAPSULE | Freq: Once | ORAL | Status: AC
  Administered 2016-04-10: 500 mg via ORAL
  Filled 2016-04-10: qty 1

## 2016-04-10 MED ORDER — HYDROCODONE-ACETAMINOPHEN 7.5-325 MG PO TABS: ORAL_TABLET | ORAL | 0 refills | Status: DC

## 2016-04-10 MED ORDER — CEPHALEXIN 500 MG PO CAPS: 500.0000 mg | ORAL_CAPSULE | Freq: Four times a day (QID) | ORAL | 0 refills | Status: DC

## 2016-04-10 MED ORDER — IBUPROFEN 800 MG PO TABS: 800.0000 mg | ORAL_TABLET | Freq: Three times a day (TID) | ORAL | 0 refills | Status: DC

## 2016-04-10 MED ORDER — LIDOCAINE HCL (PF) 2 % IJ SOLN
10.0000 mL | Freq: Once | INTRAMUSCULAR | Status: AC
  Administered 2016-04-10: 10 mL via INTRADERMAL
  Filled 2016-04-10: qty 10

## 2016-04-10 NOTE — Discharge Instructions (Signed)
Elevate your hand when possible.  Warm water soaks 3 times a day.  Return here if needed

## 2016-04-10 NOTE — ED Triage Notes (Signed)
Pt c/o left ring finger pain x 4 days. Denies injury. Pt states pain radiates up arm.

## 2016-04-10 NOTE — Patient Instructions (Signed)
Back Exercises Introduction If you have pain in your back, do these exercises 2-3 times each day or as told by your doctor. When the pain goes away, do the exercises once each day, but repeat the steps more times for each exercise (do more repetitions). If you do not have pain in your back, do these exercises once each day or as told by your doctor. Exercises Single Knee to Chest  Do these steps 3-5 times in a row for each leg: 1. Lie on your back on a firm bed or the floor with your legs stretched out. 2. Bring one knee to your chest. 3. Hold your knee to your chest by grabbing your knee or thigh. 4. Pull on your knee until you feel a gentle stretch in your lower back. 5. Keep doing the stretch for 10-30 seconds. 6. Slowly let go of your leg and straighten it. Pelvic Tilt  Do these steps 5-10 times in a row: 1. Lie on your back on a firm bed or the floor with your legs stretched out. 2. Bend your knees so they point up to the ceiling. Your feet should be flat on the floor. 3. Tighten your lower belly (abdomen) muscles to press your lower back against the floor. This will make your tailbone point up to the ceiling instead of pointing down to your feet or the floor. 4. Stay in this position for 5-10 seconds while you gently tighten your muscles and breathe evenly. Cat-Cow  Do these steps until your lower back bends more easily: 1. Get on your hands and knees on a firm surface. Keep your hands under your shoulders, and keep your knees under your hips. You may put padding under your knees. 2. Let your head hang down, and make your tailbone point down to the floor so your lower back is round like the back of a cat. 3. Stay in this position for 5 seconds. 4. Slowly lift your head and make your tailbone point up to the ceiling so your back hangs low (sags) like the back of a cow. 5. Stay in this position for 5 seconds. Press-Ups  Do these steps 5-10 times in a row: 1. Lie on your belly  (face-down) on the floor. 2. Place your hands near your head, about shoulder-width apart. 3. While you keep your back relaxed and keep your hips on the floor, slowly straighten your arms to raise the top half of your body and lift your shoulders. Do not use your back muscles. To make yourself more comfortable, you may change where you place your hands. 4. Stay in this position for 5 seconds. 5. Slowly return to lying flat on the floor. Bridges  Do these steps 10 times in a row: 1. Lie on your back on a firm surface. 2. Bend your knees so they point up to the ceiling. Your feet should be flat on the floor. 3. Tighten your butt muscles and lift your butt off of the floor until your waist is almost as high as your knees. If you do not feel the muscles working in your butt and the back of your thighs, slide your feet 1-2 inches farther away from your butt. 4. Stay in this position for 3-5 seconds. 5. Slowly lower your butt to the floor, and let your butt muscles relax. If this exercise is too easy, try doing it with your arms crossed over your chest. Belly Crunches  Do these steps 5-10 times in a row: 1. Lie   on your back on a firm bed or the floor with your legs stretched out. 2. Bend your knees so they point up to the ceiling. Your feet should be flat on the floor. 3. Cross your arms over your chest. 4. Tip your chin a little bit toward your chest but do not bend your neck. 5. Tighten your belly muscles and slowly raise your chest just enough to lift your shoulder blades a tiny bit off of the floor. 6. Slowly lower your chest and your head to the floor. Back Lifts  Do these steps 5-10 times in a row: 1. Lie on your belly (face-down) with your arms at your sides, and rest your forehead on the floor. 2. Tighten the muscles in your legs and your butt. 3. Slowly lift your chest off of the floor while you keep your hips on the floor. Keep the back of your head in line with the curve in your back.  Look at the floor while you do this. 4. Stay in this position for 3-5 seconds. 5. Slowly lower your chest and your face to the floor. Contact a doctor if:  Your back pain gets a lot worse when you do an exercise.  Your back pain does not lessen 2 hours after you exercise. If you have any of these problems, stop doing the exercises. Do not do them again unless your doctor says it is okay. Get help right away if:  You have sudden, very bad back pain. If this happens, stop doing the exercises. Do not do them again unless your doctor says it is okay. This information is not intended to replace advice given to you by your health care provider. Make sure you discuss any questions you have with your health care provider. Document Released: 04/07/2010 Document Revised: 08/11/2015 Document Reviewed: 04/29/2014  2017 Elsevier  
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
## 2016-04-10 NOTE — ED Notes (Signed)
ED Provider at bedside. 

## 2016-04-10 NOTE — ED Provider Notes (Signed)
[facility] DEPT Provider Note   CSN: [id number] Arrival date & time: 04/10/16  1443     History   Chief Complaint Chief Complaint  Patient presents with  . Hand Pain    HPI SHAMEEK Tanner is a 46 y.o. male.  HPI  Steven Tanner is a 46 y.o. male who presents to the Emergency Department complaining of persistent left ring finger pain, redness and swelling.  Symptoms present of 4 days.  He admits to biting his nails and noticed pain began on the medial side of his fingernail.  He reports throbbing pain up his hand to his forearm and describes that he "feels his heart beating in my finger"  He denies injury, numbness or red streaks.      Past Medical History:  Diagnosis Date  . Abscess   . GERD (gastroesophageal reflux disease)     There are no active problems to display for this patient.   Past Surgical History:  Procedure Laterality Date  . INCISE AND DRAIN ABCESS     pilonodil cyst-[facility]  . PILONIDAL CYST EXCISION N/A 11/02/2013   Procedure: CYST EXCISION PILONIDAL SIMPLE;  Surgeon: Jamesetta So, MD;  Location: [facility];  Service: General;  Laterality: N/A;       Home Medications    Prior to Admission medications   Medication Sig Start Date End Date Taking? Authorizing Provider  cyclobenzaprine (FLEXERIL) 10 MG tablet Take 1 tablet (10 mg total) by mouth 3 (three) times daily as needed. 02/28/16   Willy Vorce, PA-C  HYDROcodone-acetaminophen (NORCO) 7.5-325 MG tablet One every four hours for pain as needed.  Do not drive car or operate machinery while taking this medicine.  Must last 14 days. 04/10/16   Sanjuana Kava, MD  ranitidine (ACID REDUCER) 150 MG tablet Take 150 mg by mouth daily as needed for heartburn.    Historical Provider, MD    Family History Family History  Problem Relation Age of Onset  . Hypertension Mother   . Diabetes Father   . Hypertension Father     Social History Social History  Substance Use Topics  . Smoking status: Current  Every Day Smoker    Packs/day: 1.00    Years: 20.00    Types: Cigarettes, Cigars    Start date: 02/15/1995  . Smokeless tobacco: Never Used  . Alcohol use Yes     Comment: rare     Allergies   Codeine and Sulfa antibiotics   Review of Systems Review of Systems  Constitutional: Negative for chills and fever.  Musculoskeletal: Positive for arthralgias and joint swelling.  Skin: Negative for color change and wound.  Neurological: Negative for weakness and numbness.  All other systems reviewed and are negative.    Physical Exam Updated Vital Signs BP 142/59 (BP Location: Right Arm)   Pulse 112   Temp 98.9 F (37.2 C) (Oral)   Resp 20   Ht 5\' 10"  (1.778 m)   Wt 122.5 kg   SpO2 100%   BMI 38.74 kg/m   Physical Exam  Constitutional: He is oriented to person, place, and time. He appears well-developed and well-nourished. No distress.  HENT:  Head: Atraumatic.  Mouth/Throat: Oropharynx is clear and moist.  Neck: Normal range of motion.  Cardiovascular: Normal rate and regular rhythm.   Pulmonary/Chest: Effort normal.  Musculoskeletal: He exhibits edema and tenderness.  Focal erythema and edema of the distal tip of the left ring finger.  ttp at the ulnar aspect of the  nail.  Nail intact.    Neurological: He is alert and oriented to person, place, and time.  Skin: Skin is warm.  Nursing note and vitals reviewed.    ED Treatments / Results  Labs (all labs ordered are listed, but only abnormal results are displayed) Labs Reviewed - No data to display  EKG  EKG Interpretation None       Radiology Dg Finger Ring Left  Result Date: 04/10/2016 CLINICAL DATA:  Left ring finger pain for 4 days.  No injury. EXAM: LEFT RING FINGER 2+V COMPARISON:  None. FINDINGS: Negative for fracture or dislocation involving the ring finger. Difficult to exclude mild soft tissue swelling. Alignment of the finger is normal. IMPRESSION: No acute bone abnormality. Electronically Signed    By: Markus Daft M.D.   On: 04/10/2016 15:53    Procedures .Marland KitchenIncision and Drainage Date/Time: 04/10/2016 6:30 AM Performed by: Kem Parkinson Authorized by: Kem Parkinson   Consent:    Consent obtained:  Verbal   Consent given by:  Patient   Risks discussed:  Incomplete drainage, infection and bleeding Location:    Indications for incision and drainage: paronchyia.   Location:  Upper extremity   Upper extremity location:  Finger   Finger location:  L ring finger Pre-procedure details:    Skin preparation:  Betadine Anesthesia (see MAR for exact dosages):    Anesthesia method:  Nerve block   Block location:  Digital   Block needle gauge:  25 G Procedure type:    Complexity:  Complex Procedure details:    Needle aspiration: no     Incision types:  Stab incision   Incision depth:  Subcutaneous   Scalpel blade:  11   Wound management:  Probed and deloculated and irrigated with saline   Drainage:  Purulent   Drainage amount:  Moderate   Wound treatment:  Wound left open   Packing materials:  None Post-procedure details:    Patient tolerance of procedure:  Tolerated well, no immediate complications   (including critical care time)  Medications Ordered in ED Medications  cephALEXin (KEFLEX) capsule 500 mg (not administered)  lidocaine (XYLOCAINE) 2 % injection 10 mL (10 mLs Intradermal Given 04/10/16 1828)     Initial Impression / Assessment and Plan / ED Course  I have reviewed the triage vital signs and the nursing notes.  Pertinent labs & imaging results that were available during my care of the patient were reviewed by me and considered in my medical decision making (see chart for details).      Pt with paronychia of the finger.  NV intact.  Pain improved after I&D procedure, tolerated well  Pt is tachycardic.  Denies other symptoms.  Will observe, po fluids and recheck.   On recheck, patient has ambulated in the department is well-appearing. And requesting  discharge. Tachycardia has improved slightly. He appears stable for discharge discussed need for PCP follow-up regarding his tachycardia. advised to avoid cold medications and excessive caffeine. He agrees to plan and return precautions were also given.     Final Clinical Impressions(s) / ED Diagnoses   Final diagnoses:  Paronychia of finger of left hand    New Prescriptions New Prescriptions   No medications on file     Bufford Lope 04/10/16 [place], MD 04/16/16 1758

## 2016-04-24 ENCOUNTER — Encounter: Payer: Self-pay | Admitting: Orthopaedic Surgery

## 2016-04-24 ENCOUNTER — Ambulatory Visit (INDEPENDENT_AMBULATORY_CARE_PROVIDER_SITE_OTHER): Payer: Self-pay | Admitting: Orthopaedic Surgery

## 2016-04-24 ENCOUNTER — Encounter: Payer: Self-pay | Admitting: Family Medicine

## 2016-04-24 ENCOUNTER — Ambulatory Visit (INDEPENDENT_AMBULATORY_CARE_PROVIDER_SITE_OTHER): Payer: Self-pay | Admitting: Family Medicine

## 2016-04-24 VITALS — BP 118/73 | HR 107 | Temp 97.8°F | Ht 70.0 in | Wt 265.0 lb

## 2016-04-24 VITALS — BP 142/96 | HR 96 | Temp 97.9°F | Ht 70.0 in | Wt 264.0 lb

## 2016-04-24 DIAGNOSIS — M545 Low back pain, unspecified: Secondary | ICD-10-CM

## 2016-04-24 DIAGNOSIS — Z0000 Encounter for general adult medical examination without abnormal findings: Secondary | ICD-10-CM

## 2016-04-24 DIAGNOSIS — E1165 Type 2 diabetes mellitus with hyperglycemia: Secondary | ICD-10-CM

## 2016-04-24 DIAGNOSIS — R631 Polydipsia: Secondary | ICD-10-CM

## 2016-04-24 DIAGNOSIS — IMO0001 Reserved for inherently not codable concepts without codable children: Secondary | ICD-10-CM

## 2016-04-24 LAB — GLUCOSE HEMOCUE WAIVED: Glu Hemocue Waived: 370 mg/dL — ABNORMAL HIGH (ref 65–99)

## 2016-04-24 LAB — BAYER DCA HB A1C WAIVED: HB A1C (BAYER DCA - WAIVED): 10.6 % — ABNORMAL HIGH (ref <7.0)

## 2016-04-24 MED ORDER — BLOOD GLUCOSE MONITOR KIT: PACK | 0 refills | Status: DC

## 2016-04-24 MED ORDER — METFORMIN HCL ER 500 MG PO TB24: 500.0000 mg | ORAL_TABLET | Freq: Every day | ORAL | 2 refills | Status: DC

## 2016-04-24 MED ORDER — TRIAMCINOLONE ACETONIDE 0.1 % EX CREA: TOPICAL_CREAM | CUTANEOUS | 5 refills | Status: DC

## 2016-04-24 MED ORDER — MOMETASONE FUROATE 0.1 % EX CREA: 1.0000 "application " | TOPICAL_CREAM | Freq: Every day | CUTANEOUS | 5 refills | Status: DC

## 2016-04-24 NOTE — Patient Instructions (Signed)
Diabetes Mellitus and Food It is important for you to manage your blood sugar (glucose) level. Your blood glucose level can be greatly affected by what you eat. Eating healthier foods in the appropriate amounts throughout the day at about the same time each day will help you control your blood glucose level. It can also help slow or prevent worsening of your diabetes mellitus. Healthy eating may even help you improve the level of your blood pressure and reach or maintain a healthy weight. General recommendations for healthful eating and cooking habits include:  Eating meals and snacks regularly. Avoid going long periods of time without eating to lose weight.  Eating a diet that consists mainly of plant-based foods, such as fruits, vegetables, nuts, legumes, and whole grains.  Using low-heat cooking methods, such as baking, instead of high-heat cooking methods, such as deep frying.  Work with your dietitian to make sure you understand how to use the Nutrition Facts information on food labels. How can food affect me? Carbohydrates Carbohydrates affect your blood glucose level more than any other type of food. Your dietitian will help you determine how many carbohydrates to eat at each meal and teach you how to count carbohydrates. Counting carbohydrates is important to keep your blood glucose at a healthy level, especially if you are using insulin or taking certain medicines for diabetes mellitus. Alcohol Alcohol can cause sudden decreases in blood glucose (hypoglycemia), especially if you use insulin or take certain medicines for diabetes mellitus. Hypoglycemia can be a life-threatening condition. Symptoms of hypoglycemia (sleepiness, dizziness, and disorientation) are similar to symptoms of having too much alcohol. If your health care provider has given you approval to drink alcohol, do so in moderation and use the following guidelines:  Women should not have more than one drink per day, and men  should not have more than two drinks per day. One drink is equal to: ? 12 oz of beer. ? 5 oz of wine. ? 1 oz of hard liquor.  Do not drink on an empty stomach.  Keep yourself hydrated. Have water, diet soda, or unsweetened iced tea.  Regular soda, juice, and other mixers might contain a lot of carbohydrates and should be counted.  What foods are not recommended? As you make food choices, it is important to remember that all foods are not the same. Some foods have fewer nutrients per serving than other foods, even though they might have the same number of calories or carbohydrates. It is difficult to get your body what it needs when you eat foods with fewer nutrients. Examples of foods that you should avoid that are high in calories and carbohydrates but low in nutrients include:  Trans fats (most processed foods list trans fats on the Nutrition Facts label).  Regular soda.  Juice.  Candy.  Sweets, such as cake, pie, doughnuts, and cookies.  Fried foods.  What foods can I eat? Eat nutrient-rich foods, which will nourish your body and keep you healthy. The food you should eat also will depend on several factors, including:  The calories you need.  The medicines you take.  Your weight.  Your blood glucose level.  Your blood pressure level.  Your cholesterol level.  You should eat a variety of foods, including:  Protein. ? Lean cuts of meat. ? Proteins low in saturated fats, such as fish, egg whites, and beans. Avoid processed meats.  Fruits and vegetables. ? Fruits and vegetables that may help control blood glucose levels, such as apples,   mangoes, and yams.  Dairy products. ? Choose fat-free or low-fat dairy products, such as milk, yogurt, and cheese.  Grains, bread, pasta, and rice. ? Choose whole grain products, such as multigrain bread, whole oats, and brown rice. These foods may help control blood pressure.  Fats. ? Foods containing healthful fats, such as  nuts, avocado, olive oil, canola oil, and fish.  Does everyone with diabetes mellitus have the same meal plan? Because every person with diabetes mellitus is different, there is not one meal plan that works for everyone. It is very important that you meet with a dietitian who will help you create a meal plan that is just right for you. This information is not intended to replace advice given to you by your health care provider. Make sure you discuss any questions you have with your health care provider. Document Released: 11/30/2004 Document Revised: 08/11/2015 Document Reviewed: 01/30/2013 Elsevier Interactive Patient Education  2017 Elsevier Inc.  

## 2016-04-24 NOTE — Patient Instructions (Addendum)
Return to work on 04/30/1016   Steps to Quit Smoking Smoking tobacco can be bad for your health. It can also affect almost every organ in your body. Smoking puts you and people around you at risk for many serious Jacklin Zwick-lasting (chronic) diseases. Quitting smoking is hard, but it is one of the best things that you can do for your health. It is never too late to quit. What are the benefits of quitting smoking? When you quit smoking, you lower your risk for getting serious diseases and conditions. They can include:  Lung cancer or lung disease.  Heart disease.  Stroke.  Heart attack.  Not being able to have children (infertility).  Weak bones (osteoporosis) and broken bones (fractures). If you have coughing, wheezing, and shortness of breath, those symptoms may get better when you quit. You may also get sick less often. If you are pregnant, quitting smoking can help to lower your chances of having a baby of low birth weight. What can I do to help me quit smoking? Talk with your doctor about what can help you quit smoking. Some things you can do (strategies) include:  Quitting smoking totally, instead of slowly cutting back how much you smoke over a period of time.  Going to in-person counseling. You are more likely to quit if you go to many counseling sessions.  Using resources and support systems, such as:  Online chats with a Social worker.  Phone quitlines.  Printed Furniture conservator/restorer.  Support groups or group counseling.  Text messaging programs.  Mobile phone apps or applications.  Taking medicines. Some of these medicines may have nicotine in them. If you are pregnant or breastfeeding, do not take any medicines to quit smoking unless your doctor says it is okay. Talk with your doctor about counseling or other things that can help you. Talk with your doctor about using more than one strategy at the same time, such as taking medicines while you are also going to in-person  counseling. This can help make quitting easier. What things can I do to make it easier to quit? Quitting smoking might feel very hard at first, but there is a lot that you can do to make it easier. Take these steps:  Talk to your family and friends. Ask them to support and encourage you.  Call phone quitlines, reach out to support groups, or work with a Social worker.  Ask people who smoke to not smoke around you.  Avoid places that make you want (trigger) to smoke, such as:  Bars.  Parties.  Smoke-break areas at work.  Spend time with people who do not smoke.  Lower the stress in your life. Stress can make you want to smoke. Try these things to help your stress:  Getting regular exercise.  Deep-breathing exercises.  Yoga.  Meditating.  Doing a body scan. To do this, close your eyes, focus on one area of your body at a time from head to toe, and notice which parts of your body are tense. Try to relax the muscles in those areas.  Download or buy apps on your mobile phone or tablet that can help you stick to your quit plan. There are many free apps, such as QuitGuide from the [employer] Office manager for Disease Control and Prevention). You can find more support from smokefree.gov and other websites. This information is not intended to replace advice given to you by your health care provider. Make sure you discuss any questions you have with your health care provider.  Document Released: 12/30/2008 Document Revised: 11/01/2015 Document Reviewed: 07/20/2014 Elsevier Interactive Patient Education  2017 [employer].

## 2016-04-24 NOTE — Progress Notes (Signed)
Patient [id number] Steven Tanner, male DOB:1970/12/02, 46 y.o. [id number]  Chief Complaint  Patient presents with  . Follow-up    Acute midline low back pain without sciatica    HPI  Steven Tanner is a 46 y.o. male who has acute back pain. He is a little better.  He has been doing the exercises we gave him to do.  He has been taking his medicine.  He has less pain. He would like to return to work next week.  I agree to this. HPI  Body mass index is 37.88 kg/m.  ROS  Review of Systems  HENT: Negative for congestion.   Respiratory: Negative for cough and shortness of breath.   Cardiovascular: Negative for chest pain and leg swelling.  Endocrine: Negative for cold intolerance.  Musculoskeletal: Positive for arthralgias and back pain.  Allergic/Immunologic: Negative for environmental allergies.    Past Medical History:  Diagnosis Date  . Abscess   . GERD (gastroesophageal reflux disease)     Past Surgical History:  Procedure Laterality Date  . INCISE AND DRAIN ABCESS     pilonodil cyst-[facility]  . PILONIDAL CYST EXCISION N/A 11/02/2013   Procedure: CYST EXCISION PILONIDAL SIMPLE;  Surgeon: Jamesetta So, MD;  Location: [facility];  Service: General;  Laterality: N/A;    Family History  Problem Relation Age of Onset  . Hypertension Mother   . Diabetes Father   . Hypertension Father     Social History Social History  Substance Use Topics  . Smoking status: Current Every Day Smoker    Packs/day: 1.00    Years: 20.00    Types: Cigarettes, Cigars    Start date: 02/15/1995  . Smokeless tobacco: Never Used  . Alcohol use Yes     Comment: rare    Allergies  Allergen Reactions  . Codeine Hives, Itching and Swelling  . Sulfa Antibiotics Rash    Current Outpatient Prescriptions  Medication Sig Dispense Refill  . cephALEXin (KEFLEX) 500 MG capsule Take 1 capsule (500 mg total) by mouth 4 (four) times daily. For 7 days 28 capsule 0  . cyclobenzaprine (FLEXERIL) 10 MG tablet  Take 1 tablet (10 mg total) by mouth 3 (three) times daily as needed. 21 tablet 0  . HYDROcodone-acetaminophen (NORCO) 7.5-325 MG tablet One every four hours for pain as needed.  Do not drive car or operate machinery while taking this medicine.  Must last 14 days. 56 tablet 0  . ibuprofen (ADVIL,MOTRIN) 800 MG tablet Take 1 tablet (800 mg total) by mouth 3 (three) times daily. 21 tablet 0  . ranitidine (ACID REDUCER) 150 MG tablet Take 150 mg by mouth daily as needed for heartburn.     No current facility-administered medications for this visit.      Physical Exam  Blood pressure (!) 142/96, pulse 96, temperature 97.9 F (36.6 C), height 5\' 10"  (1.778 m), weight 264 lb (119.7 kg).  Constitutional: overall normal hygiene, normal nutrition, well developed, normal grooming, normal body habitus. Assistive device:none  Musculoskeletal: gait and station Limp none, muscle tone and strength are normal, no tremors or atrophy is present.  .  Neurological: coordination overall normal.  Deep tendon reflex/nerve stretch intact.  Sensation normal.  Cranial nerves II-XII intact.   Skin:   Normal overall no scars, lesions, ulcers or rashes. No psoriasis.  Psychiatric: Alert and oriented x 3.  Recent memory intact, remote memory unclear.  Normal mood and affect. Well groomed.  Good eye contact.  Cardiovascular: overall no  swelling, no varicosities, no edema bilaterally, normal temperatures of the legs and arms, no clubbing, cyanosis and good capillary refill.  Lymphatic: palpation is normal.  Spine/Pelvis examination:  Inspection:  Overall, sacoiliac joint benign and hips nontender; without crepitus or defects.   Thoracic spine inspection: Alignment normal without kyphosis present   Lumbar spine inspection:  Alignment  with normal lumbar lordosis, without scoliosis apparent.   Thoracic spine palpation:  without tenderness of spinal processes   Lumbar spine palpation: with tenderness of lumbar  area; without tightness of lumbar muscles    Range of Motion:   Lumbar flexion, forward flexion is 45 without pain or tenderness    Lumbar extension is 10 without pain or tenderness   Left lateral bend is Normal  without pain or tenderness   Right lateral bend is Normal without pain or tenderness   Straight leg raising is Normal   Strength & tone: Normal   Stability overall normal stability     The patient has been educated about the nature of the problem(s) and counseled on treatment options.  The patient appeared to understand what I have discussed and is in agreement with it.  Encounter Diagnosis  Name Primary?  . Acute midline low back pain without sciatica Yes    PLAN Call if any problems.  Precautions discussed.  Continue current medications.   Return to clinic 3 weeks   Electronically Signed Sanjuana Kava, MD 2/6/20189:58 AM

## 2016-04-24 NOTE — Progress Notes (Signed)
Subjective:  Patient ID: Steven Tanner, male    DOB: Jan 12, 1971  Age: 46 y.o. MRN: [id number]  CC: Annual Exam (pt here today for CPE)   HPI [name] presents for concerned about being a diabetic. He has a lot of polydipsia - drinking water constantly. Having frequent urination. Also has lost about 20 lbs without effort recently. 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 Both parents are diabetic. Pt. Also has razor bumps. Grew a beard but they still occur in beard area. Tend to pop releasing pus.Also has a lump at the right mandibular line.  History Coree has a past medical history of Abscess and GERD (gastroesophageal reflux disease).   He has a past surgical history that includes Incise and drain abcess and Pilonidal cyst excision (N/A, 11/02/2013).   His family history includes Diabetes in his father; Hypertension in his father and mother.He reports that he has been smoking Cigarettes and Cigars.  He started smoking about 21 years ago. He has a 20.00 pack-year smoking history. He has never used smokeless tobacco. He reports that he drinks alcohol. He reports that he uses drugs, including Marijuana.  Current Outpatient Prescriptions on File Prior to Visit  Medication Sig Dispense Refill  . cyclobenzaprine (FLEXERIL) 10 MG tablet Take 1 tablet (10 mg total) by mouth 3 (three) times daily as needed. 21 tablet 0  . HYDROcodone-acetaminophen (NORCO) 7.5-325 MG tablet One every four hours for pain as needed.  Do not drive car or operate machinery while taking this medicine.  Must last 14 days. 56 tablet 0  . ibuprofen (ADVIL,MOTRIN) 800 MG tablet Take 1 tablet (800 mg total) by mouth 3 (three) times daily. 21 tablet 0  . ranitidine (ACID REDUCER) 150 MG tablet Take 150 mg by mouth daily as needed for heartburn.     No current facility-administered medications on file prior to visit.     ROS Review of Systems  Constitutional: Negative for chills, diaphoresis, fever and  unexpected weight change.  HENT: Negative for congestion, hearing loss, rhinorrhea and sore throat.   Eyes: Negative for visual disturbance.  Respiratory: Negative for cough and shortness of breath.   Cardiovascular: Negative for chest pain.  Gastrointestinal: Negative for abdominal pain, constipation and diarrhea.  Endocrine: Positive for polydipsia and polyuria. Negative for cold intolerance, heat intolerance and polyphagia.  Genitourinary: Positive for frequency. Negative for difficulty urinating, dysuria and flank pain.  Musculoskeletal: Negative for arthralgias and joint swelling.  Skin: Negative for rash.  Neurological: Negative for dizziness and headaches.  Psychiatric/Behavioral: Negative for dysphoric mood and sleep disturbance.    Objective:  BP 118/73   Pulse (!) 107   Temp 97.8 F (36.6 C) (Oral)   Ht '5\' 10"'  (1.778 m)   Wt 265 lb (120.2 kg)   BMI 38.02 kg/m   Physical Exam  Assessment & Plan:   Laster was seen today for annual exam.  Diagnoses and all orders for this visit:  Well adult exam -     CBC with Differential/Platelet -     CMP14+EGFR -     Lipid panel -     Bayer DCA Hb A1c Waived -     Glucose Hemocue Waived  Polydipsia -     Microalbumin / creatinine urine ratio  Uncontrolled type 2 diabetes mellitus without complication, without long-term current use of insulin ([place])  Other orders -     metFORMIN (GLUCOPHAGE-XR) 500 MG 24 hr tablet; Take 1  tablet (500 mg total) by mouth daily with breakfast. -     blood glucose meter kit and supplies KIT; Dispense based on patient and insurance preference. Use up to four times daily as directed. (FOR ICD-9 250.00, 250.01). -     mometasone (ELOCON) 0.1 % cream; Apply 1 application topically daily. To bumps on face -     triamcinolone cream (KENALOG) 0.1 %; Apply to affected areas of baody three times a day. Avoid face and groin   I have discontinued Mr. Erazo cephALEXin. I am also having him start on  metFORMIN, blood glucose meter kit and supplies, mometasone, and triamcinolone cream. Additionally, I am having him maintain his ranitidine, cyclobenzaprine, HYDROcodone-acetaminophen, and ibuprofen.  Meds ordered this encounter  Medications  . metFORMIN (GLUCOPHAGE-XR) 500 MG 24 hr tablet    Sig: Take 1 tablet (500 mg total) by mouth daily with breakfast.    Dispense:  30 tablet    Refill:  2  . blood glucose meter kit and supplies KIT    Sig: Dispense based on patient and insurance preference. Use up to four times daily as directed. (FOR ICD-9 250.00, 250.01).    Dispense:  1 each    Refill:  0    Order Specific Question:   Number of strips    Answer:   78    Order Specific Question:   Number of lancets    Answer:   50  . mometasone (ELOCON) 0.1 % cream    Sig: Apply 1 application topically daily. To bumps on face    Dispense:  45 g    Refill:  5  . triamcinolone cream (KENALOG) 0.1 %    Sig: Apply to affected areas of baody three times a day. Avoid face and groin    Dispense:  45 g    Refill:  5   Appt. With Tammy Eckerd for Diabetic education  Follow-up: Return in about 1 month (around 05/22/2016).  Claretta Fraise, M.D.

## 2016-04-25 LAB — CMP14+EGFR
A/G RATIO: 1.6 (ref 1.2–2.2)
ALBUMIN: 4.4 g/dL (ref 3.5–5.5)
ALT: 31 IU/L (ref 0–44)
AST: 22 IU/L (ref 0–40)
Alkaline Phosphatase: 86 IU/L (ref 39–117)
BUN / CREAT RATIO: 12 (ref 9–20)
BUN: 15 mg/dL (ref 6–24)
Bilirubin Total: 0.3 mg/dL (ref 0.0–1.2)
CALCIUM: 9.6 mg/dL (ref 8.7–10.2)
CO2: 19 mmol/L (ref 18–29)
CREATININE: 1.27 mg/dL (ref 0.76–1.27)
Chloride: 96 mmol/L (ref 96–106)
GFR, EST AFRICAN AMERICAN: 79 mL/min/{1.73_m2} (ref >59)
GFR, EST NON AFRICAN AMERICAN: 69 mL/min/{1.73_m2} (ref >59)
GLOBULIN, TOTAL: 2.8 g/dL (ref 1.5–4.5)
Glucose: 546 mg/dL (ref 65–99)
POTASSIUM: 4.9 mmol/L (ref 3.5–5.2)
SODIUM: 132 mmol/L — AB (ref 134–144)
TOTAL PROTEIN: 7.2 g/dL (ref 6.0–8.5)

## 2016-04-25 LAB — CBC WITH DIFFERENTIAL/PLATELET
BASOS: 1 %
Basophils Absolute: 0.1 10*3/uL (ref 0.0–0.2)
EOS (ABSOLUTE): 0.3 10*3/uL (ref 0.0–0.4)
EOS: 3 %
HEMATOCRIT: 45 % (ref 37.5–51.0)
HEMOGLOBIN: 15 g/dL (ref 13.0–17.7)
IMMATURE GRANULOCYTES: 0 %
Immature Grans (Abs): 0 10*3/uL (ref 0.0–0.1)
Lymphocytes Absolute: 3.5 10*3/uL — ABNORMAL HIGH (ref 0.7–3.1)
Lymphs: 42 %
MCH: 28.6 pg (ref 26.6–33.0)
MCHC: 33.3 g/dL (ref 31.5–35.7)
MCV: 86 fL (ref 79–97)
MONOCYTES: 7 %
MONOS ABS: 0.6 10*3/uL (ref 0.1–0.9)
NEUTROS PCT: 47 %
Neutrophils Absolute: 3.9 10*3/uL (ref 1.4–7.0)
Platelets: 257 10*3/uL (ref 150–379)
RBC: 5.24 x10E6/uL (ref 4.14–5.80)
RDW: 13.7 % (ref 12.3–15.4)
WBC: 8.3 10*3/uL (ref 3.4–10.8)

## 2016-04-25 LAB — MICROALBUMIN / CREATININE URINE RATIO
CREATININE, UR: 52.7 mg/dL
MICROALB/CREAT RATIO: 15.2 mg/g{creat} (ref 0.0–30.0)
Microalbumin, Urine: 8 ug/mL

## 2016-04-25 LAB — LIPID PANEL
CHOL/HDL RATIO: 12.1 ratio — AB (ref 0.0–5.0)
Cholesterol, Total: 218 mg/dL — ABNORMAL HIGH (ref 100–199)
HDL: 18 mg/dL — ABNORMAL LOW (ref >39)
TRIGLYCERIDES: 1045 mg/dL — AB (ref 0–149)

## 2016-04-30 ENCOUNTER — Telehealth: Payer: Self-pay | Admitting: *Deleted

## 2016-04-30 ENCOUNTER — Telehealth: Payer: Self-pay | Admitting: General Practice

## 2016-04-30 ENCOUNTER — Other Ambulatory Visit: Payer: Self-pay | Admitting: Family Medicine

## 2016-04-30 MED ORDER — METFORMIN HCL ER 500 MG PO TB24: 1000.0000 mg | ORAL_TABLET | Freq: Every day | ORAL | 2 refills | Status: DC

## 2016-04-30 NOTE — Telephone Encounter (Signed)
Patient called stating that at 1pm BS was 289 patient then ate a chicken sandwich and soft drink  and took metformin. 2 hours after eating around 3 pm BS was 473.  Patient states that he feels fine, no blurred vision, dry mouth or any other symptoms.  Patient checked BS while on the phone at 4 pm and BS was 424

## 2016-04-30 NOTE — Telephone Encounter (Signed)
Call given to nurse °

## 2016-05-15 ENCOUNTER — Ambulatory Visit (INDEPENDENT_AMBULATORY_CARE_PROVIDER_SITE_OTHER): Payer: Self-pay | Admitting: Orthopaedic Surgery

## 2016-05-15 VITALS — BP 133/86 | HR 95 | Temp 97.7°F | Ht 70.0 in | Wt 265.0 lb

## 2016-05-15 DIAGNOSIS — M545 Low back pain, unspecified: Secondary | ICD-10-CM

## 2016-05-15 NOTE — Patient Instructions (Addendum)
Steps to Quit Smoking Smoking tobacco can be bad for your health. It can also affect almost every organ in your body. Smoking puts you and people around you at risk for many serious Steven Tanner-lasting (chronic) diseases. Quitting smoking is hard, but it is one of the best things that you can do for your health. It is never too late to quit. What are the benefits of quitting smoking? When you quit smoking, you lower your risk for getting serious diseases and conditions. They can include:  Lung cancer or lung disease.  Heart disease.  Stroke.  Heart attack.  Not being able to have children (infertility).  Weak bones (osteoporosis) and broken bones (fractures). If you have coughing, wheezing, and shortness of breath, those symptoms may get better when you quit. You may also get sick less often. If you are pregnant, quitting smoking can help to lower your chances of having a baby of low birth weight. What can I do to help me quit smoking? Talk with your doctor about what can help you quit smoking. Some things you can do (strategies) include:  Quitting smoking totally, instead of slowly cutting back how much you smoke over a period of time.  Going to in-person counseling. You are more likely to quit if you go to many counseling sessions.  Using resources and support systems, such as:  Online chats with a counselor.  Phone quitlines.  Printed self-help materials.  Support groups or group counseling.  Text messaging programs.  Mobile phone apps or applications.  Taking medicines. Some of these medicines may have nicotine in them. If you are pregnant or breastfeeding, do not take any medicines to quit smoking unless your doctor says it is okay. Talk with your doctor about counseling or other things that can help you. Talk with your doctor about using more than one strategy at the same time, such as taking medicines while you are also going to in-person counseling. This can help make quitting  easier. What things can I do to make it easier to quit? Quitting smoking might feel very hard at first, but there is a lot that you can do to make it easier. Take these steps:  Talk to your family and friends. Ask them to support and encourage you.  Call phone quitlines, reach out to support groups, or work with a counselor.  Ask people who smoke to not smoke around you.  Avoid places that make you want (trigger) to smoke, such as:  Bars.  Parties.  Smoke-break areas at work.  Spend time with people who do not smoke.  Lower the stress in your life. Stress can make you want to smoke. Try these things to help your stress:  Getting regular exercise.  Deep-breathing exercises.  Yoga.  Meditating.  Doing a body scan. To do this, close your eyes, focus on one area of your body at a time from head to toe, and notice which parts of your body are tense. Try to relax the muscles in those areas.  Download or buy apps on your mobile phone or tablet that can help you stick to your quit plan. There are many free apps, such as QuitGuide from the CDC (Centers for Disease Control and Prevention). You can find more support from smokefree.gov and other websites. This information is not intended to replace advice given to you by your health care provider. Make sure you discuss any questions you have with your health care provider. Document Released: 12/30/2008 Document Revised: 11/01/2015 Document   Reviewed: 07/20/2014 Elsevier Interactive Patient Education  2017 Elsevier Inc.  

## 2016-05-15 NOTE — Progress Notes (Signed)
Patient Steven Tanner, male DOB:[birth year], 46 y.o. ZHY:[id number]  Chief Complaint  Patient presents with  . Follow-up    Back pain    HPI  Steven Tanner is a 46 y.o. male who has had acute lower back pain.  He is much improved.  He has little pain now.  He has returned to work.  I went over precautions with him.  I will see him as needed. HPI  Body mass index is 38.02 kg/m.  ROS  Review of Systems  HENT: Negative for congestion.   Respiratory: Negative for cough and shortness of breath.   Cardiovascular: Negative for chest pain and leg swelling.  Endocrine: Negative for cold intolerance.  Musculoskeletal: Positive for arthralgias and back pain.  Allergic/Immunologic: Negative for environmental allergies.    Past Medical History:  Diagnosis Date  . Abscess   . GERD (gastroesophageal reflux disease)     Past Surgical History:  Procedure Laterality Date  . INCISE AND DRAIN ABCESS     pilonodil cyst-[facility]  . PILONIDAL CYST EXCISION N/A 11/02/2013   Procedure: CYST EXCISION PILONIDAL SIMPLE;  Surgeon: Jamesetta So, MD;  Location: [facility];  Service: General;  Laterality: N/A;    Family History  Problem Relation Age of Onset  . Hypertension Mother   . Diabetes Father   . Hypertension Father     Social History Social History  Substance Use Topics  . Smoking status: Current Every Day Smoker    Packs/day: 1.00    Years: 20.00    Types: Cigarettes, Cigars    Start date: 02/15/1995  . Smokeless tobacco: Never Used  . Alcohol use Yes     Comment: rare    Allergies  Allergen Reactions  . Codeine Hives, Itching and Swelling  . Sulfa Antibiotics Rash    Current Outpatient Prescriptions  Medication Sig Dispense Refill  . blood glucose meter kit and supplies KIT Dispense based on patient and insurance preference. Use up to four times daily as directed. (FOR ICD-9 250.00, 250.01). 1 each 0  . cyclobenzaprine (FLEXERIL) 10 MG tablet Take 1 tablet (10 mg total)  by mouth 3 (three) times daily as needed. 21 tablet 0  . HYDROcodone-acetaminophen (NORCO) 7.5-325 MG tablet One every four hours for pain as needed.  Do not drive car or operate machinery while taking this medicine.  Must last 14 days. 56 tablet 0  . ibuprofen (ADVIL,MOTRIN) 800 MG tablet Take 1 tablet (800 mg total) by mouth 3 (three) times daily. 21 tablet 0  . metFORMIN (GLUCOPHAGE-XR) 500 MG 24 hr tablet Take 2 tablets (1,000 mg total) by mouth daily with breakfast. 60 tablet 2  . mometasone (ELOCON) 0.1 % cream Apply 1 application topically daily. To bumps on face 45 g 5  . ranitidine (ACID REDUCER) 150 MG tablet Take 150 mg by mouth daily as needed for heartburn.    . triamcinolone cream (KENALOG) 0.1 % Apply to affected areas of baody three times a day. Avoid face and groin 45 g 5   No current facility-administered medications for this visit.      Physical Exam  Blood pressure 133/86, pulse 95, temperature 97.7 F (36.5 C), height _0  (1.778 m), weight 265 lb (120.2 kg).  Constitutional: overall normal hygiene, normal nutrition, well developed, normal grooming, normal body habitus. Assistive device:none  Musculoskeletal: gait and station Limp none, muscle tone and strength are normal, no tremors or atrophy is present.  .  Neurological: coordination overall normal.  Deep  tendon reflex/nerve stretch intact.  Sensation normal.  Cranial nerves II-XII intact.   Skin:   Normal overall no scars, lesions, ulcers or rashes. No psoriasis.  Psychiatric: Alert and oriented x 3.  Recent memory intact, remote memory unclear.  Normal mood and affect. Well groomed.  Good eye contact.  Cardiovascular: overall no swelling, no varicosities, no edema bilaterally, normal temperatures of the legs and arms, no clubbing, cyanosis and good capillary refill.  Lymphatic: palpation is normal.  He has full ROM of his lower back and can touch his toes.  He has no spasm.  SLR is negative. Gait is normal.   Normal back exam.   The patient has been educated about the nature of the problem(s) and counseled on treatment options.  The patient appeared to understand what I have discussed and is in agreement with it.  Encounter Diagnosis  Name Primary?  . Acute midline low back pain without sciatica Yes    PLAN Call if any problems.  Precautions discussed.  Continue current medications.   Return to clinic PRN   Electronically Signed Sanjuana Kava, MD 2/27/20183:00 PM

## 2016-05-16 ENCOUNTER — Ambulatory Visit: Payer: Self-pay | Admitting: Pharmacist

## 2016-05-25 ENCOUNTER — Ambulatory Visit: Payer: Self-pay | Admitting: Family Medicine

## 2016-06-01 ENCOUNTER — Ambulatory Visit (INDEPENDENT_AMBULATORY_CARE_PROVIDER_SITE_OTHER): Payer: Self-pay | Admitting: Family Medicine

## 2016-06-01 ENCOUNTER — Encounter: Payer: Self-pay | Admitting: Family Medicine

## 2016-06-01 VITALS — BP 121/74 | HR 86 | Temp 97.6°F | Ht 70.0 in | Wt 262.0 lb

## 2016-06-01 DIAGNOSIS — IMO0001 Reserved for inherently not codable concepts without codable children: Secondary | ICD-10-CM

## 2016-06-01 DIAGNOSIS — E119 Type 2 diabetes mellitus without complications: Secondary | ICD-10-CM | POA: Insufficient documentation

## 2016-06-01 DIAGNOSIS — E1165 Type 2 diabetes mellitus with hyperglycemia: Secondary | ICD-10-CM

## 2016-06-01 NOTE — Progress Notes (Signed)
Subjective:  Patient ID: Steven Tanner, male    DOB: 1970/08/21  Age: 46 y.o. MRN: [id number]  CC: Diabetes (pt here today for a 1 month follow up after being diagnosed with Type 2 diabetes last month.)   HPI Steven Tanner presents forFollow-up of diabetes. Patient checks blood sugar at home.   90-113 fasting and 126 postprandial Patient denies symptoms such as polyuria, polydipsia, excessive hunger, nausea No significant hypoglycemic spells noted. Medications reviewed. Pt reports taking them regularly without complication/adverse reaction being reported today.  Checking feet daily. Last eye appt was needs to be arranged.  History Steven Tanner has a past medical history of Abscess and GERD (gastroesophageal reflux disease).   Steven Tanner has a past surgical history that includes Incise and drain abcess and Pilonidal cyst excision (N/A, 11/02/2013).   Steven Tanner family history includes Diabetes in Steven Tanner father; Hypertension in Steven Tanner father and mother.Steven Tanner reports that Steven Tanner has been smoking Cigarettes and Cigars.  Steven Tanner started smoking about 21 years ago. Steven Tanner has a 20.00 pack-year smoking history. Steven Tanner has never used smokeless tobacco. Steven Tanner reports that Steven Tanner drinks alcohol. Steven Tanner reports that Steven Tanner uses drugs, including Marijuana.  Current Outpatient Prescriptions on File Prior to Visit  Medication Sig Dispense Refill  . blood glucose meter kit and supplies KIT Dispense based on patient and insurance preference. Use up to four times daily as directed. (FOR ICD-9 250.00, 250.01). 1 each 0  . HYDROcodone-acetaminophen (NORCO) 7.5-325 MG tablet One every four hours for pain as needed.  Do not drive car or operate machinery while taking this medicine.  Must last 14 days. 56 tablet 0  . ibuprofen (ADVIL,MOTRIN) 800 MG tablet Take 1 tablet (800 mg total) by mouth 3 (three) times daily. 21 tablet 0  . metFORMIN (GLUCOPHAGE-XR) 500 MG 24 hr tablet Take 2 tablets (1,000 mg total) by mouth daily with breakfast. 60 tablet 2   No current  facility-administered medications on file prior to visit.     ROS Review of Systems  Constitutional: Negative for chills, diaphoresis and fever.  HENT: Negative for rhinorrhea and sore throat.   Respiratory: Negative for cough and shortness of breath.   Cardiovascular: Negative for chest pain.  Gastrointestinal: Negative for abdominal pain.  Musculoskeletal: Negative for arthralgias and myalgias.  Skin: Negative for rash.  Neurological: Negative for weakness and headaches.    Objective:  BP 121/74   Pulse 86   Temp 97.6 F (36.4 C) (Oral)   Ht _0  (1.778 m)   Wt 262 lb (118.8 kg)   BMI 37.59 kg/m   BP Readings from Last 3 Encounters:  06/01/16 121/74  05/15/16 133/86  04/24/16 118/73    Wt Readings from Last 3 Encounters:  06/01/16 262 lb (118.8 kg)  05/15/16 265 lb (120.2 kg)  04/24/16 265 lb (120.2 kg)     Physical Exam  Constitutional: Steven Tanner appears well-developed and well-nourished.  HENT:  Head: Normocephalic and atraumatic.  Right Ear: Tympanic membrane and external ear normal. No decreased hearing is noted.  Left Ear: Tympanic membrane and external ear normal. No decreased hearing is noted.  Mouth/Throat: No oropharyngeal exudate or posterior oropharyngeal erythema.  Eyes: Pupils are equal, round, and reactive to light.  Neck: Normal range of motion. Neck supple.  Cardiovascular: Normal rate and regular rhythm.   No murmur heard. Pulmonary/Chest: Breath sounds normal. No respiratory distress.  Abdominal: Soft. Bowel sounds are normal. Steven Tanner exhibits no mass. There is no tenderness.  Vitals reviewed.   No components  found for: BAYER     Assessment & Plan:   Esaw was seen today for diabetes.  Diagnoses and all orders for this visit:  Uncontrolled type 2 diabetes mellitus without complication, without long-term current use of insulin ([place]) -     Ambulatory referral to Ophthalmology      I have discontinued Steven Tanner ranitidine,  cyclobenzaprine, mometasone, and triamcinolone cream. I am also having him maintain Steven Tanner HYDROcodone-acetaminophen, ibuprofen, blood glucose meter kit and supplies, and metFORMIN.  No orders of the defined types were placed in this encounter.    Follow-up: Return in about 2 months (around 08/01/2016).  Claretta Fraise, M.D.

## 2016-06-19 ENCOUNTER — Encounter: Payer: Self-pay | Admitting: Family Medicine

## 2016-06-19 ENCOUNTER — Ambulatory Visit (INDEPENDENT_AMBULATORY_CARE_PROVIDER_SITE_OTHER): Payer: Self-pay | Admitting: Family Medicine

## 2016-06-19 VITALS — BP 117/65 | HR 91 | Temp 98.3°F | Ht 70.0 in | Wt 265.0 lb

## 2016-06-19 DIAGNOSIS — L97511 Non-pressure chronic ulcer of other part of right foot limited to breakdown of skin: Secondary | ICD-10-CM

## 2016-06-19 DIAGNOSIS — E08621 Diabetes mellitus due to underlying condition with foot ulcer: Secondary | ICD-10-CM

## 2016-06-19 MED ORDER — NYSTATIN-TRIAMCINOLONE 100000-0.1 UNIT/GM-% EX OINT: 1.0000 "application " | TOPICAL_OINTMENT | Freq: Two times a day (BID) | CUTANEOUS | 2 refills | Status: AC

## 2016-06-19 NOTE — Progress Notes (Signed)
Subjective:  Patient ID: Steven Tanner, male    DOB: 06/07/1970  Age: 46 y.o. MRN: [id number]  CC: No chief complaint on file.   HPI KORTLAND NICHOLS presents for Blister at the web space between the left first and second toe. Present for several weeks. Not responding to previously offered treatment.  History Meet has a past medical history of Abscess and GERD (gastroesophageal reflux disease).   He has a past surgical history that includes Incise and drain abcess and Pilonidal cyst excision (N/A, 11/02/2013).   His family history includes Diabetes in his father; Hypertension in his father and mother.He reports that he has been smoking Cigarettes and Cigars.  He started smoking about 21 years ago. He has a 20.00 pack-year smoking history. He has never used smokeless tobacco. He reports that he drinks alcohol. He reports that he uses drugs, including Marijuana.  Current Outpatient Prescriptions on File Prior to Visit  Medication Sig Dispense Refill  . blood glucose meter kit and supplies KIT Dispense based on patient and insurance preference. Use up to four times daily as directed. (FOR ICD-9 250.00, 250.01). 1 each 0  . ibuprofen (ADVIL,MOTRIN) 800 MG tablet Take 1 tablet (800 mg total) by mouth 3 (three) times daily. 21 tablet 0  . metFORMIN (GLUCOPHAGE-XR) 500 MG 24 hr tablet Take 2 tablets (1,000 mg total) by mouth daily with breakfast. 60 tablet 2  . HYDROcodone-acetaminophen (NORCO) 7.5-325 MG tablet One every four hours for pain as needed.  Do not drive car or operate machinery while taking this medicine.  Must last 14 days. (Patient not taking: Reported on 06/19/2016) 56 tablet 0   No current facility-administered medications on file prior to visit.     ROS Review of Systems Noncontributory Objective:  BP 117/65   Pulse 91   Temp 98.3 F (36.8 C) (Oral)   Ht '5\' 10"'  (1.778 m)   Wt 265 lb (120.2 kg)   BMI 38.02 kg/m   Physical Exam  Constitutional: He is oriented to  person, place, and time. He appears well-developed and well-nourished.  HENT:  Head: Normocephalic and atraumatic.  Right Ear: External ear normal.  Left Ear: External ear normal.  Mouth/Throat: No oropharyngeal exudate or posterior oropharyngeal erythema.  Eyes: Pupils are equal, round, and reactive to light.  Neck: Normal range of motion. Neck supple.  Cardiovascular: Normal rate and regular rhythm.   No murmur heard. Pulmonary/Chest: Breath sounds normal. No respiratory distress.  Neurological: He is alert and oriented to person, place, and time.  Skin:  Between the left first and second toes at the web space dorsally there is a crusted erythematous papule approximately 8 mm in diameter  Vitals reviewed.   Assessment & Plan:   Diagnoses and all orders for this visit:  Diabetic ulcer of toe of right foot associated with diabetes mellitus due to underlying condition, limited to breakdown of skin ([place])  Other orders -     nystatin-triamcinolone ointment (MYCOLOG); Apply 1 application topically 2 (two) times daily.   I am having Mr. Echavarria start on nystatin-triamcinolone ointment. I am also having him maintain his HYDROcodone-acetaminophen, ibuprofen, blood glucose meter kit and supplies, and metFORMIN.  Meds ordered this encounter  Medications  . nystatin-triamcinolone ointment (MYCOLOG)    Sig: Apply 1 application topically 2 (two) times daily.    Dispense:  60 g    Refill:  2     Follow-up: Return in about 3 months (around 09/18/2016).  Claretta Fraise, M.D.

## 2016-07-17 ENCOUNTER — Telehealth: Payer: Self-pay | Admitting: Pharmacist

## 2016-07-17 ENCOUNTER — Ambulatory Visit: Payer: Self-pay | Admitting: Pharmacist

## 2016-07-17 NOTE — Telephone Encounter (Signed)
Noted that patient has cancelled appointment for diabetes education.  I called patient to make sure BG was improving.  Patient reports BG ranges at home from 100 to 120.  Reminded patient that A1c is due in May or June, he did not want to make appt to see PCP while on the phone today but he will call back to make appt.

## 2016-08-06 ENCOUNTER — Ambulatory Visit (INDEPENDENT_AMBULATORY_CARE_PROVIDER_SITE_OTHER): Payer: Self-pay | Admitting: Family Medicine

## 2016-08-06 ENCOUNTER — Encounter: Payer: Self-pay | Admitting: Family Medicine

## 2016-08-06 VITALS — BP 118/66 | HR 96 | Temp 99.2°F | Ht 70.0 in | Wt 265.0 lb

## 2016-08-06 DIAGNOSIS — N5203 Combined arterial insufficiency and corporo-venous occlusive erectile dysfunction: Secondary | ICD-10-CM

## 2016-08-06 DIAGNOSIS — IMO0001 Reserved for inherently not codable concepts without codable children: Secondary | ICD-10-CM

## 2016-08-06 DIAGNOSIS — E1165 Type 2 diabetes mellitus with hyperglycemia: Secondary | ICD-10-CM

## 2016-08-06 LAB — BAYER DCA HB A1C WAIVED: HB A1C: 6.4 % (ref <7.0)

## 2016-08-06 MED ORDER — SILDENAFIL CITRATE 20 MG PO TABS: 20.0000 mg | ORAL_TABLET | Freq: Every day | ORAL | 5 refills | Status: DC | PRN

## 2016-08-06 NOTE — Progress Notes (Signed)
Subjective:  Patient ID: Steven Tanner, male    DOB: Sep 04, 1970  Age: 46 y.o. MRN: [id number]  CC: Diabetes (pt here today for follow up on his diabetes, foot exam and labs)   HPI VANSH RECKART presents forFollow-up of diabetes. Patient checks blood sugar at home.   80-100 fasting and 100-120 postprandial Patient denies symptoms such as polyuria, polydipsia, excessive hunger, nausea No significant hypoglycemic spells noted. Medications reviewed. Pt reports taking them regularly without complication/adverse reaction being reported today.  Checking feet daily. Last eye appt was none  History Kyel has a past medical history of Abscess and GERD (gastroesophageal reflux disease).   He has a past surgical history that includes Incise and drain abcess and Pilonidal cyst excision (N/A, 11/02/2013).   His family history includes Diabetes in his father; Hypertension in his father and mother.He reports that he has been smoking Cigarettes and Cigars.  He started smoking about 21 years ago. He has a 20.00 pack-year smoking history. He has never used smokeless tobacco. He reports that he drinks alcohol. He reports that he uses drugs, including Marijuana.  Current Outpatient Prescriptions on File Prior to Visit  Medication Sig Dispense Refill  . blood glucose meter kit and supplies KIT Dispense based on patient and insurance preference. Use up to four times daily as directed. (FOR ICD-9 250.00, 250.01). 1 each 0  . ibuprofen (ADVIL,MOTRIN) 800 MG tablet Take 1 tablet (800 mg total) by mouth 3 (three) times daily. 21 tablet 0  . metFORMIN (GLUCOPHAGE-XR) 500 MG 24 hr tablet Take 2 tablets (1,000 mg total) by mouth daily with breakfast. 60 tablet 2   No current facility-administered medications on file prior to visit.     ROS Review of Systems  Constitutional: Negative for chills, diaphoresis, fever and unexpected weight change.  HENT: Negative for congestion, hearing loss, rhinorrhea and sore  throat.   Eyes: Negative for visual disturbance.  Respiratory: Negative for cough and shortness of breath.   Cardiovascular: Negative for chest pain.  Gastrointestinal: Negative for abdominal pain, constipation and diarrhea.  Genitourinary: Negative for dysuria and flank pain.  Musculoskeletal: Negative for arthralgias and joint swelling.  Skin: Negative for rash.  Neurological: Negative for dizziness and headaches.  Psychiatric/Behavioral: Negative for dysphoric mood and sleep disturbance.    Objective:  BP 118/66   Pulse 96   Temp 99.2 F (37.3 C) (Oral)   Ht '5\' 10"'$  (1.778 m)   Wt 265 lb (120.2 kg)   BMI 38.02 kg/m   BP Readings from Last 3 Encounters:  08/06/16 118/66  06/19/16 117/65  06/01/16 121/74    Wt Readings from Last 3 Encounters:  08/06/16 265 lb (120.2 kg)  06/19/16 265 lb (120.2 kg)  06/01/16 262 lb (118.8 kg)     Physical Exam  Constitutional: He is oriented to person, place, and time. He appears well-developed and well-nourished. No distress.  HENT:  Head: Normocephalic and atraumatic.  Right Ear: External ear normal.  Left Ear: External ear normal.  Nose: Nose normal.  Mouth/Throat: Oropharynx is clear and moist.  Eyes: Conjunctivae and EOM are normal. Pupils are equal, round, and reactive to light.  Neck: Normal range of motion. Neck supple. No thyromegaly present.  Cardiovascular: Normal rate, regular rhythm and normal heart sounds.   No murmur heard. Pulmonary/Chest: Effort normal and breath sounds normal. No respiratory distress. He has no wheezes. He has no rales.  Abdominal: Soft. Bowel sounds are normal. He exhibits no distension. There is no  tenderness.  Lymphadenopathy:    He has no cervical adenopathy.  Neurological: He is alert and oriented to person, place, and time. He has normal reflexes.  Skin: Skin is warm and dry.  Psychiatric: He has a normal mood and affect. His behavior is normal. Judgment and thought content normal.     Diabetic Foot Exam - Simple   Simple Foot Form Visual Inspection No deformities, no ulcerations, no other skin breakdown bilaterally:  Yes Sensation Testing Intact to touch and monofilament testing bilaterally:  Yes Pulse Check Posterior Tibialis and Dorsalis pulse intact bilaterally:  Yes Comments        Assessment & Plan:   Borden was seen today for diabetes.  Diagnoses and all orders for this visit:  Combined arterial insufficiency and corporo-venous occlusive erectile dysfunction -     sildenafil (REVATIO) 20 MG tablet; Take 1 tablet (20 mg total) by mouth daily as needed (2-5 as needed).  Uncontrolled type 2 diabetes mellitus without complication, without long-term current use of insulin ([facility]) -     Microalbumin / creatinine urine ratio -     Bayer DCA Hb A1c Waived -     Lipid panel -     Ambulatory referral to Ophthalmology -     CMP14+EGFR  Other orders -     Discontinue: sildenafil (REVATIO) 20 MG tablet; Take 1 tablet (20 mg total) by mouth daily as needed (2-5 as needed).      I have discontinued Mr. Erker HYDROcodone-acetaminophen. I am also having him maintain his ibuprofen, blood glucose meter kit and supplies, metFORMIN, and sildenafil.  Meds ordered this encounter  Medications  . DISCONTD: sildenafil (REVATIO) 20 MG tablet    Sig: Take 1 tablet (20 mg total) by mouth daily as needed (2-5 as needed).    Dispense:  50 tablet    Refill:  5  . sildenafil (REVATIO) 20 MG tablet    Sig: Take 1 tablet (20 mg total) by mouth daily as needed (2-5 as needed).    Dispense:  50 tablet    Refill:  5     Follow-up: Return in about 3 months (around 11/06/2016).  Claretta Fraise, M.D.

## 2016-08-07 ENCOUNTER — Other Ambulatory Visit: Payer: Self-pay | Admitting: *Deleted

## 2016-08-07 LAB — CMP14+EGFR
ALT: 22 IU/L (ref 0–44)
AST: 16 IU/L (ref 0–40)
Albumin/Globulin Ratio: 1.7 (ref 1.2–2.2)
Albumin: 4.5 g/dL (ref 3.5–5.5)
Alkaline Phosphatase: 66 IU/L (ref 39–117)
BILIRUBIN TOTAL: 0.2 mg/dL (ref 0.0–1.2)
BUN/Creatinine Ratio: 11 (ref 9–20)
BUN: 14 mg/dL (ref 6–24)
CHLORIDE: 106 mmol/L (ref 96–106)
CO2: 21 mmol/L (ref 18–29)
Calcium: 9.7 mg/dL (ref 8.7–10.2)
Creatinine, Ser: 1.22 mg/dL (ref 0.76–1.27)
GFR calc Af Amer: 83 mL/min/{1.73_m2} (ref >59)
GFR calc non Af Amer: 72 mL/min/{1.73_m2} (ref >59)
GLOBULIN, TOTAL: 2.6 g/dL (ref 1.5–4.5)
Glucose: 75 mg/dL (ref 65–99)
POTASSIUM: 4.6 mmol/L (ref 3.5–5.2)
SODIUM: 141 mmol/L (ref 134–144)
Total Protein: 7.1 g/dL (ref 6.0–8.5)

## 2016-08-07 LAB — LIPID PANEL
CHOL/HDL RATIO: 9.7 ratio — AB (ref 0.0–5.0)
CHOLESTEROL TOTAL: 242 mg/dL — AB (ref 100–199)
HDL: 25 mg/dL — ABNORMAL LOW (ref >39)
LDL CALC: 156 mg/dL — AB (ref 0–99)
Triglycerides: 307 mg/dL — ABNORMAL HIGH (ref 0–149)
VLDL CHOLESTEROL CAL: 61 mg/dL — AB (ref 5–40)

## 2016-08-07 LAB — MICROALBUMIN / CREATININE URINE RATIO
CREATININE, UR: 150.7 mg/dL
MICROALB/CREAT RATIO: 3.5 mg/g{creat} (ref 0.0–30.0)
Microalbumin, Urine: 5.2 ug/mL

## 2016-08-07 MED ORDER — ATORVASTATIN CALCIUM 40 MG PO TABS: 40.0000 mg | ORAL_TABLET | Freq: Every day | ORAL | 1 refills | Status: DC

## 2016-08-27 ENCOUNTER — Telehealth: Payer: Self-pay | Admitting: Family Medicine

## 2016-08-27 NOTE — Telephone Encounter (Signed)
Spoke with pt and Dr Dettinger just happened to overhear my conversation and states that there is a Teacher, adult education in the system and that is why it shows on MyChart that he is overdue for his Diabetic foot exam although it was completed at his appt 08/06/16 and pt was advised of this and voiced understanding.

## 2016-11-06 ENCOUNTER — Ambulatory Visit (INDEPENDENT_AMBULATORY_CARE_PROVIDER_SITE_OTHER): Payer: Self-pay | Admitting: Family Medicine

## 2016-11-06 ENCOUNTER — Encounter: Payer: Self-pay | Admitting: Family Medicine

## 2016-11-06 VITALS — BP 118/69 | HR 83 | Temp 97.7°F | Ht 70.0 in | Wt 260.0 lb

## 2016-11-06 DIAGNOSIS — IMO0001 Reserved for inherently not codable concepts without codable children: Secondary | ICD-10-CM

## 2016-11-06 DIAGNOSIS — E1165 Type 2 diabetes mellitus with hyperglycemia: Secondary | ICD-10-CM

## 2016-11-06 LAB — BAYER DCA HB A1C WAIVED: HB A1C (BAYER DCA - WAIVED): 5.8 % (ref <7.0)

## 2016-11-06 MED ORDER — METFORMIN HCL ER 500 MG PO TB24: 1000.0000 mg | ORAL_TABLET | Freq: Every day | ORAL | 2 refills | Status: DC

## 2016-11-06 NOTE — Progress Notes (Signed)
Subjective:  Patient ID: Steven Tanner, male    DOB: 1970-05-13  Age: 46 y.o. MRN: [id number]  CC: Diabetes (pt here today for routine follow up of his diabetes, no other concerns voiced.)   HPI Steven Tanner presents forFollow-up of diabetes. Patient checks blood sugar at home rarely. He is on the road a lot. He just and think about it when he's driving.   3 times that he has checked it he says that it's between 66 and 80. Patient denies symptoms such as polyuria, polydipsia, excessive hunger, nausea No significant hypoglycemic spells noted. Medications reviewed. Pt reports taking themg occasionally when he thinks about it. Not checking feet daily. Last eye appt was about 1 month ago with [facility] in [place] has a past medical history of Abscess and GERD (gastroesophageal reflux disease).   He has a past surgical history that includes Incise and drain abcess and Pilonidal cyst excision (N/A, 11/02/2013).   His family history includes Diabetes in his father; Hypertension in his father and mother.He reports that he has been smoking Cigarettes and Cigars.  He started smoking about 21 years ago. He has a 20.00 pack-year smoking history. He has never used smokeless tobacco. He reports that he drinks alcohol. He reports that he uses drugs, including Marijuana.  Current Outpatient Prescriptions on File Prior to Visit  Medication Sig Dispense Refill  . sildenafil (REVATIO) 20 MG tablet Take 1 tablet (20 mg total) by mouth daily as needed (2-5 as needed). 50 tablet 5   No current facility-administered medications on file prior to visit.     ROS Review of Systems  Constitutional: Negative for chills, diaphoresis, fever and unexpected weight change.  HENT: Negative for congestion, hearing loss, rhinorrhea and sore throat.   Eyes: Negative for visual disturbance.  Respiratory: Negative for cough and shortness of breath.   Cardiovascular: Negative for chest pain.    Gastrointestinal: Negative for abdominal pain, constipation and diarrhea.  Genitourinary: Negative for dysuria and flank pain.  Musculoskeletal: Negative for arthralgias and joint swelling.  Skin: Negative for rash.  Neurological: Negative for dizziness and headaches.  Psychiatric/Behavioral: Negative for dysphoric mood and sleep disturbance.    Objective:  BP 118/69   Pulse 83   Temp 97.7 F (36.5 C) (Oral)   Ht '5\' 10"'  (1.778 m)   Wt 260 lb (117.9 kg)   BMI 37.31 kg/m   BP Readings from Last 3 Encounters:  11/06/16 118/69  08/06/16 118/66  06/19/16 117/65    Wt Readings from Last 3 Encounters:  11/06/16 260 lb (117.9 kg)  08/06/16 265 lb (120.2 kg)  06/19/16 265 lb (120.2 kg)     Physical Exam  Constitutional: He is oriented to person, place, and time. He appears well-developed and well-nourished. No distress.  HENT:  Head: Normocephalic and atraumatic.  Right Ear: External ear normal.  Left Ear: External ear normal.  Nose: Nose normal.  Mouth/Throat: Oropharynx is clear and moist.  Eyes: Pupils are equal, round, and reactive to light. Conjunctivae and EOM are normal.  Neck: Normal range of motion. Neck supple. No thyromegaly present.  Cardiovascular: Normal rate, regular rhythm and normal heart sounds.   No murmur heard. Pulmonary/Chest: Effort normal and breath sounds normal. No respiratory distress. He has no wheezes. He has no rales.  Abdominal: Soft. Bowel sounds are normal. He exhibits no distension. There is no tenderness.  Lymphadenopathy:    He has no cervical adenopathy.  Neurological: He is  alert and oriented to person, place, and time. He has normal reflexes.  Skin: Skin is warm and dry.  Psychiatric: He has a normal mood and affect. His behavior is normal. Judgment and thought content normal.    Hemoglobin A1c equaled 5.8    Assessment & Plan:   Steven Tanner was seen today for diabetes.  Diagnoses and all orders for this visit:  Uncontrolled type  2 diabetes mellitus without complication, without long-term current use of insulin (HCC) -     Bayer DCA Hb A1c Waived -     CMP14+EGFR -     Microalbumin / creatinine urine ratio  Other orders -     metFORMIN (GLUCOPHAGE-XR) 500 MG 24 hr tablet; Take 2 tablets (1,000 mg total) by mouth daily with breakfast.      I have discontinued Steven Tanner ibuprofen, blood glucose meter kit and supplies, and atorvastatin. I am also having him maintain his sildenafil and metFORMIN.  Meds ordered this encounter  Medications  . metFORMIN (GLUCOPHAGE-XR) 500 MG 24 hr tablet    Sig: Take 2 tablets (1,000 mg total) by mouth daily with breakfast.    Dispense:  60 tablet    Refill:  2     Follow-up: Return in about 3 months (around 02/06/2017).  Claretta Fraise, M.D.

## 2016-11-06 NOTE — Patient Instructions (Signed)

## 2016-11-07 LAB — CMP14+EGFR
A/G RATIO: 1.5 (ref 1.2–2.2)
ALBUMIN: 4.3 g/dL (ref 3.5–5.5)
ALT: 22 IU/L (ref 0–44)
AST: 19 IU/L (ref 0–40)
Alkaline Phosphatase: 59 IU/L (ref 39–117)
BUN/Creatinine Ratio: 12 (ref 9–20)
BUN: 13 mg/dL (ref 6–24)
Bilirubin Total: 0.2 mg/dL (ref 0.0–1.2)
CALCIUM: 9.7 mg/dL (ref 8.7–10.2)
CO2: 21 mmol/L (ref 20–29)
CREATININE: 1.08 mg/dL (ref 0.76–1.27)
Chloride: 106 mmol/L (ref 96–106)
GFR, EST AFRICAN AMERICAN: 96 mL/min/{1.73_m2} (ref >59)
GFR, EST NON AFRICAN AMERICAN: 83 mL/min/{1.73_m2} (ref >59)
GLOBULIN, TOTAL: 2.8 g/dL (ref 1.5–4.5)
Glucose: 121 mg/dL — ABNORMAL HIGH (ref 65–99)
POTASSIUM: 5 mmol/L (ref 3.5–5.2)
SODIUM: 141 mmol/L (ref 134–144)
TOTAL PROTEIN: 7.1 g/dL (ref 6.0–8.5)

## 2016-11-07 LAB — MICROALBUMIN / CREATININE URINE RATIO
Creatinine, Urine: 217.5 mg/dL
MICROALBUM., U, RANDOM: 7.2 ug/mL
Microalb/Creat Ratio: 3.3 mg/g creat (ref 0.0–30.0)

## 2017-02-06 ENCOUNTER — Ambulatory Visit (INDEPENDENT_AMBULATORY_CARE_PROVIDER_SITE_OTHER): Payer: Self-pay | Admitting: Family Medicine

## 2017-02-12 ENCOUNTER — Ambulatory Visit: Payer: Self-pay | Admitting: Family Medicine

## 2017-03-25 ENCOUNTER — Ambulatory Visit: Payer: Self-pay | Admitting: Family Medicine

## 2017-04-01 ENCOUNTER — Ambulatory Visit: Payer: Self-pay | Admitting: Family Medicine

## 2017-04-01 ENCOUNTER — Encounter: Payer: Self-pay | Admitting: Family Medicine

## 2017-04-01 VITALS — BP 115/70 | HR 88 | Temp 97.1°F | Ht 70.0 in | Wt 265.0 lb

## 2017-04-01 DIAGNOSIS — N5203 Combined arterial insufficiency and corporo-venous occlusive erectile dysfunction: Secondary | ICD-10-CM

## 2017-04-01 DIAGNOSIS — E1165 Type 2 diabetes mellitus with hyperglycemia: Secondary | ICD-10-CM

## 2017-04-01 DIAGNOSIS — IMO0001 Reserved for inherently not codable concepts without codable children: Secondary | ICD-10-CM

## 2017-04-01 LAB — BAYER DCA HB A1C WAIVED: HB A1C: 6.3 % (ref <7.0)

## 2017-04-01 MED ORDER — SILDENAFIL CITRATE 20 MG PO TABS: 20.0000 mg | ORAL_TABLET | Freq: Every day | ORAL | 5 refills | Status: DC | PRN

## 2017-04-01 MED ORDER — METFORMIN HCL ER 500 MG PO TB24: 1000.0000 mg | ORAL_TABLET | Freq: Every day | ORAL | 5 refills | Status: DC

## 2017-04-01 NOTE — Progress Notes (Signed)
Subjective:  Patient ID: Steven Tanner,  male    DOB: 1970-10-25  Age: 47 y.o.    CC: Diabetes (pt here today for routine follow up of his chronic medical conditions)   HPI Steven Tanner presents for  follow-up of hypertension. Patient has no history of headache chest pain or shortness of breath or recent cough. Patient also denies symptoms of TIA such as numbness weakness lateralizing. Patient checks  blood pressure at home. Recent readings have been good Patient denies side effects from medication. States taking it regularly.  Patient also  in for follow-up of elevated cholesterol. Doing well without complaints on current medication. Denies side effects of statin including myalgia and arthralgia and nausea. Also in today for liver function testing. Currently no chest pain, shortness of breath or other cardiovascular related symptoms noted.  Follow-up of diabetes. Patient does check blood sugar at home. Readings run between 90-163fasting and 120-130 post prandial Patient denies symptoms such as polyuria, polydipsia, excessive hunger, nausea No significant hypoglycemic spells noted. Medications reviewed. Pt reports taking them regularly. Pt. denies complication/adverse reaction today.   Concerned about E.D. History Steven Tanner has a past medical history of Abscess and GERD (gastroesophageal reflux disease).   He has a past surgical history that includes Incise and drain abcess and Pilonidal cyst excision (N/A, 11/02/2013).   His family history includes Diabetes in his father; Hypertension in his father and mother.He reports that he has been smoking cigarettes and cigars.  He started smoking about 22 years ago. He has a 20.00 pack-year smoking history. he has never used smokeless tobacco. He reports that he drinks alcohol. He reports that he uses drugs. Drug: Marijuana.  No current outpatient medications on file prior to visit.   No current facility-administered medications on file prior  to visit.     ROS Review of Systems  Constitutional: Negative for chills, diaphoresis, fever and unexpected weight change.  HENT: Negative for congestion, hearing loss, rhinorrhea and sore throat.   Eyes: Negative for visual disturbance.  Respiratory: Negative for cough and shortness of breath.   Cardiovascular: Negative for chest pain.  Gastrointestinal: Negative for abdominal pain, constipation and diarrhea.  Genitourinary: Negative for dysuria and flank pain.  Musculoskeletal: Negative for arthralgias and joint swelling.  Skin: Negative for rash.  Neurological: Negative for dizziness and headaches.  Psychiatric/Behavioral: Negative for dysphoric mood and sleep disturbance.    Objective:  BP 115/70   Pulse 88   Temp (!) 97.1 F (36.2 C) (Oral)   Ht 5\' 10"  (1.778 m)   Wt 265 lb (120.2 kg)   BMI 38.02 kg/m   BP Readings from Last 3 Encounters:  04/01/17 115/70  11/06/16 118/69  08/06/16 118/66    Wt Readings from Last 3 Encounters:  04/01/17 265 lb (120.2 kg)  11/06/16 260 lb (117.9 kg)  08/06/16 265 lb (120.2 kg)     Physical Exam  Constitutional: He is oriented to person, place, and time. He appears well-developed and well-nourished. No distress.  HENT:  Head: Normocephalic and atraumatic.  Right Ear: External ear normal.  Left Ear: External ear normal.  Nose: Nose normal.  Mouth/Throat: Oropharynx is clear and moist.  Eyes: Conjunctivae and EOM are normal. Pupils are equal, round, and reactive to light.  Neck: Normal range of motion. Neck supple. No thyromegaly present.  Cardiovascular: Normal rate, regular rhythm and normal heart sounds.  No murmur heard. Pulmonary/Chest: Effort normal and breath sounds normal. No respiratory distress. He has no wheezes.  He has no rales.  Abdominal: Soft. Bowel sounds are normal. He exhibits no distension. There is no tenderness.  Lymphadenopathy:    He has no cervical adenopathy.  Neurological: He is alert and oriented to  person, place, and time. He has normal reflexes.  Skin: Skin is warm and dry.  Psychiatric: He has a normal mood and affect. His behavior is normal. Judgment and thought content normal.    Diabetic Foot Exam - Simple   Simple Foot Form Diabetic Foot exam was performed with the following findings:  Yes 04/01/2017  2:15 PM  Visual Inspection No deformities, no ulcerations, no other skin breakdown bilaterally:  Yes Sensation Testing Intact to touch and monofilament testing bilaterally:  Yes Pulse Check Posterior Tibialis and Dorsalis pulse intact bilaterally:  Yes Comments       Assessment & Plan:   Steven Tanner was seen today for diabetes.  Diagnoses and all orders for this visit:  Uncontrolled type 2 diabetes mellitus without complication, without long-term current use of insulin (HCC) -     Bayer DCA Hb A1c Waived  Combined arterial insufficiency and corporo-venous occlusive erectile dysfunction -     sildenafil (REVATIO) 20 MG tablet; Take 1 tablet (20 mg total) by mouth daily as needed (2-5 as needed).  Other orders -     metFORMIN (GLUCOPHAGE-XR) 500 MG 24 hr tablet; Take 2 tablets (1,000 mg total) by mouth daily with breakfast.   I am having Steven Tanner maintain his sildenafil and metFORMIN.  Meds ordered this encounter  Medications  . sildenafil (REVATIO) 20 MG tablet    Sig: Take 1 tablet (20 mg total) by mouth daily as needed (2-5 as needed).    Dispense:  50 tablet    Refill:  5  . metFORMIN (GLUCOPHAGE-XR) 500 MG 24 hr tablet    Sig: Take 2 tablets (1,000 mg total) by mouth daily with breakfast.    Dispense:  60 tablet    Refill:  5     Follow-up: Return in about 3 months (around 06/30/2017).  Claretta Fraise, M.D.

## 2017-04-02 ENCOUNTER — Ambulatory Visit: Payer: Self-pay | Admitting: Family Medicine

## 2017-09-30 ENCOUNTER — Ambulatory Visit: Payer: Self-pay | Admitting: Family Medicine

## 2017-10-09 ENCOUNTER — Encounter: Payer: Self-pay | Admitting: Family Medicine

## 2017-10-09 ENCOUNTER — Ambulatory Visit (INDEPENDENT_AMBULATORY_CARE_PROVIDER_SITE_OTHER): Payer: Self-pay | Admitting: Family Medicine

## 2017-10-09 VITALS — BP 113/70 | HR 79 | Temp 97.4°F | Ht 70.0 in | Wt 265.0 lb

## 2017-10-09 DIAGNOSIS — IMO0001 Reserved for inherently not codable concepts without codable children: Secondary | ICD-10-CM

## 2017-10-09 DIAGNOSIS — E1165 Type 2 diabetes mellitus with hyperglycemia: Secondary | ICD-10-CM

## 2017-10-09 DIAGNOSIS — N5203 Combined arterial insufficiency and corporo-venous occlusive erectile dysfunction: Secondary | ICD-10-CM

## 2017-10-09 LAB — CBC WITH DIFFERENTIAL/PLATELET
Basophils Absolute: 0.1 10*3/uL (ref 0.0–0.2)
Basos: 1 %
EOS (ABSOLUTE): 0.2 10*3/uL (ref 0.0–0.4)
EOS: 3 %
HEMATOCRIT: 42.5 % (ref 37.5–51.0)
HEMOGLOBIN: 14.1 g/dL (ref 13.0–17.7)
IMMATURE GRANS (ABS): 0 10*3/uL (ref 0.0–0.1)
Immature Granulocytes: 0 %
LYMPHS: 45 %
Lymphocytes Absolute: 3 10*3/uL (ref 0.7–3.1)
MCH: 28 pg (ref 26.6–33.0)
MCHC: 33.2 g/dL (ref 31.5–35.7)
MCV: 84 fL (ref 79–97)
MONOCYTES: 7 %
Monocytes Absolute: 0.4 10*3/uL (ref 0.1–0.9)
NEUTROS ABS: 2.9 10*3/uL (ref 1.4–7.0)
Neutrophils: 44 %
Platelets: 263 10*3/uL (ref 150–450)
RBC: 5.04 x10E6/uL (ref 4.14–5.80)
RDW: 14.6 % (ref 12.3–15.4)
WBC: 6.5 10*3/uL (ref 3.4–10.8)

## 2017-10-09 LAB — CMP14+EGFR
ALBUMIN: 4.4 g/dL (ref 3.5–5.5)
ALT: 29 IU/L (ref 0–44)
AST: 21 IU/L (ref 0–40)
Albumin/Globulin Ratio: 1.7 (ref 1.2–2.2)
Alkaline Phosphatase: 72 IU/L (ref 39–117)
BUN / CREAT RATIO: 11 (ref 9–20)
BUN: 12 mg/dL (ref 6–24)
Bilirubin Total: 0.3 mg/dL (ref 0.0–1.2)
CO2: 21 mmol/L (ref 20–29)
Calcium: 9.6 mg/dL (ref 8.7–10.2)
Chloride: 104 mmol/L (ref 96–106)
Creatinine, Ser: 1.08 mg/dL (ref 0.76–1.27)
GFR, EST AFRICAN AMERICAN: 95 mL/min/{1.73_m2} (ref >59)
GFR, EST NON AFRICAN AMERICAN: 82 mL/min/{1.73_m2} (ref >59)
GLOBULIN, TOTAL: 2.6 g/dL (ref 1.5–4.5)
Glucose: 162 mg/dL — ABNORMAL HIGH (ref 65–99)
Potassium: 4.7 mmol/L (ref 3.5–5.2)
SODIUM: 140 mmol/L (ref 134–144)
TOTAL PROTEIN: 7 g/dL (ref 6.0–8.5)

## 2017-10-09 LAB — LIPID PANEL
CHOL/HDL RATIO: 8 ratio — AB (ref 0.0–5.0)
CHOLESTEROL TOTAL: 223 mg/dL — AB (ref 100–199)
HDL: 28 mg/dL — ABNORMAL LOW (ref >39)
LDL CALC: 143 mg/dL — AB (ref 0–99)
Triglycerides: 260 mg/dL — ABNORMAL HIGH (ref 0–149)
VLDL Cholesterol Cal: 52 mg/dL — ABNORMAL HIGH (ref 5–40)

## 2017-10-09 LAB — BAYER DCA HB A1C WAIVED: HB A1C: 9 % — AB (ref <7.0)

## 2017-10-09 MED ORDER — METFORMIN HCL ER 500 MG PO TB24: 1000.0000 mg | ORAL_TABLET | Freq: Every day | ORAL | 5 refills | Status: DC

## 2017-10-09 MED ORDER — METFORMIN HCL ER 750 MG PO TB24: 750.0000 mg | ORAL_TABLET | Freq: Three times a day (TID) | ORAL | 2 refills | Status: DC

## 2017-10-09 MED ORDER — SILDENAFIL CITRATE 20 MG PO TABS: 20.0000 mg | ORAL_TABLET | Freq: Every day | ORAL | 5 refills | Status: DC | PRN

## 2017-10-09 NOTE — Progress Notes (Signed)
Subjective:  Patient ID: Steven Tanner, male    DOB: Jul 14, 1970  Age: 47 y.o. MRN: [id number]  CC: Medical Management of Chronic Issues   HPI Steven Tanner presents forFollow-up of diabetes. Patient checks blood sugar at home.   120 fasting and 160-180 postprandial Patient denies symptoms such as polyuria, polydipsia, excessive hunger, nausea No significant hypoglycemic spells noted. Medications reviewed. Pt reports taking them regularly without complication/adverse reaction being reported today.  Checking feet daily. Last eye appt was  - overdue.  Pt. Reports precordial chest pain. Episodes last a few minutes Longest is 15. Does  Not radiate. Some exertional component.   History Steven Tanner has a past medical history of Abscess and GERD (gastroesophageal reflux disease).   He has a past surgical history that includes Incise and drain abcess and Pilonidal cyst excision (N/A, 11/02/2013).   His family history includes Diabetes in his father; Heart disease in his father; Hypertension in his father and mother.He reports that he has been smoking cigarettes and cigars.  He started smoking about 22 years ago. He has a 20.00 pack-year smoking history. He has never used smokeless tobacco. He reports that he drinks alcohol. He reports that he has current or past drug history. Drug: Marijuana.  No current outpatient medications on file prior to visit.   No current facility-administered medications on file prior to visit.     ROS Review of Systems  Constitutional: Negative.   HENT: Negative.   Eyes: Negative for visual disturbance.  Respiratory: Negative for cough and shortness of breath.   Cardiovascular: Positive for chest pain. Negative for leg swelling.  Gastrointestinal: Negative for abdominal pain, diarrhea, nausea and vomiting.  Genitourinary: Negative for difficulty urinating.  Musculoskeletal: Negative for arthralgias and myalgias.  Skin: Negative for rash.  Neurological:  Negative for headaches.  Psychiatric/Behavioral: Negative for sleep disturbance.    Objective:  BP 113/70   Pulse 79   Temp (!) 97.4 F (36.3 C) (Oral)   Ht '5\' 10"'$  (1.778 m)   Wt 265 lb (120.2 kg)   BMI 38.02 kg/m   BP Readings from Last 3 Encounters:  10/09/17 113/70  04/01/17 115/70  11/06/16 118/69    Wt Readings from Last 3 Encounters:  10/09/17 265 lb (120.2 kg)  04/01/17 265 lb (120.2 kg)  11/06/16 260 lb (117.9 kg)     Physical Exam  Constitutional: He is oriented to person, place, and time. He appears well-developed and well-nourished. No distress.  HENT:  Head: Normocephalic and atraumatic.  Right Ear: External ear normal.  Left Ear: External ear normal.  Nose: Nose normal.  Mouth/Throat: Oropharynx is clear and moist.  Eyes: Pupils are equal, round, and reactive to light. Conjunctivae and EOM are normal.  Neck: Normal range of motion. Neck supple.  Cardiovascular: Normal rate, regular rhythm and normal heart sounds.  No murmur heard. Pulmonary/Chest: Effort normal and breath sounds normal. No respiratory distress. He has no wheezes. He has no rales.  Abdominal: Soft. There is no tenderness.  Musculoskeletal: Normal range of motion.  Neurological: He is alert and oriented to person, place, and time. He has normal reflexes.  Skin: Skin is warm and dry.  Psychiatric: He has a normal mood and affect. His behavior is normal. Judgment and thought content normal.      Assessment & Plan:   Steven Tanner was seen today for medical management of chronic issues.  Diagnoses and all orders for this visit:  Uncontrolled type 2 diabetes mellitus without complication, without  long-term current use of insulin (HCC) -     Lipid panel -     Ambulatory referral to Cardiology  Combined arterial insufficiency and corporo-venous occlusive erectile dysfunction -     CBC with Differential/Platelet -     CMP14+EGFR -     Microalbumin / creatinine urine ratio -     Bayer DCA Hb  A1c Waived -     sildenafil (REVATIO) 20 MG tablet; Take 1 tablet (20 mg total) by mouth daily as needed (2-5 as needed). -     Cancel: Ambulatory referral to Cardiology  Other orders -     Discontinue: metFORMIN (GLUCOPHAGE-XR) 500 MG 24 hr tablet; Take 2 tablets (1,000 mg total) by mouth daily with breakfast. -     metFORMIN (GLUCOPHAGE-XR) 750 MG 24 hr tablet; Take 1 tablet (750 mg total) by mouth 3 (three) times daily before meals.      I have discontinued [place] metFORMIN. I have also changed his metFORMIN. Additionally, I am having him maintain his sildenafil.  Meds ordered this encounter  Medications  . sildenafil (REVATIO) 20 MG tablet    Sig: Take 1 tablet (20 mg total) by mouth daily as needed (2-5 as needed).    Dispense:  50 tablet    Refill:  5  . DISCONTD: metFORMIN (GLUCOPHAGE-XR) 500 MG 24 hr tablet    Sig: Take 2 tablets (1,000 mg total) by mouth daily with breakfast.    Dispense:  60 tablet    Refill:  5  . metFORMIN (GLUCOPHAGE-XR) 750 MG 24 hr tablet    Sig: Take 1 tablet (750 mg total) by mouth 3 (three) times daily before meals.    Dispense:  90 tablet    Refill:  2    diabetes control has declined dramatically since January.You need to increase your Metformin dose, work on improved diet and exercise with weight loss. Follow up in three months  Follow-up: Return in about 3 months (around 01/09/2018).  Claretta Fraise, M.D.

## 2017-10-09 NOTE — Patient Instructions (Signed)

## 2017-10-10 LAB — MICROALBUMIN / CREATININE URINE RATIO
Creatinine, Urine: 133 mg/dL
MICROALBUM., U, RANDOM: 15.8 ug/mL
Microalb/Creat Ratio: 11.9 mg/g creat (ref 0.0–30.0)

## 2017-10-17 ENCOUNTER — Encounter: Payer: Self-pay | Admitting: Family Medicine

## 2017-12-11 ENCOUNTER — Telehealth: Payer: Self-pay | Admitting: Cardiology

## 2017-12-11 ENCOUNTER — Encounter: Payer: Self-pay | Admitting: *Deleted

## 2017-12-11 ENCOUNTER — Ambulatory Visit (INDEPENDENT_AMBULATORY_CARE_PROVIDER_SITE_OTHER): Payer: Self-pay | Admitting: Cardiology

## 2017-12-11 ENCOUNTER — Encounter: Payer: Self-pay | Admitting: Cardiology

## 2017-12-11 VITALS — BP 114/78 | HR 98 | Ht 70.0 in | Wt 269.8 lb

## 2017-12-11 DIAGNOSIS — R079 Chest pain, unspecified: Secondary | ICD-10-CM

## 2017-12-11 DIAGNOSIS — E119 Type 2 diabetes mellitus without complications: Secondary | ICD-10-CM

## 2017-12-11 MED ORDER — ASPIRIN EC 81 MG PO TBEC: 81.0000 mg | DELAYED_RELEASE_TABLET | Freq: Every day | ORAL | 3 refills | Status: AC

## 2017-12-11 NOTE — Patient Instructions (Signed)
Medication Instructions:   Your physician has recommended you make the following change in your medication:   Start aspirin 81 mg by mouth daily.  Labwork:  NONE  Testing/Procedures: Your physician has requested that you have en exercise stress myoview. For further information please visit [email or web address]. Please follow instruction sheet, as given.  Follow-Up:  Your physician recommends that you schedule a follow-up appointment in: pending.  Any Other Special Instructions Will Be Listed Below (If Applicable).  If you need a refill on your cardiac medications before your next appointment, please call your pharmacy.

## 2017-12-11 NOTE — Telephone Encounter (Signed)
Pre-cert Verification for the following procedure   Exercise Myoview (2 Day protocol) on medications scheduled for 11-4 & 11-05 at [facility]

## 2017-12-11 NOTE — Progress Notes (Signed)
Clinical Summary Steven Tanner is a 47 y.o.male seen as new consult, referred by Dr Livia Snellen for chest pain.   1. Chest pain - started about 4 month ago - sharp pain left sided, 4/10 in severity. Can occur at rest or with activity. Occasionally worst with position. Lasts up to 1 minutes. Occurs 1-2 times per month - sedentary lifestyle. Occasional DOE with activities. - no recent edema.   CAD risk factors: DM2, former tobacco x 20 years, father MI mid 3s   2. DM2 -  Reports pcp prescribed statin recently, he has not started Past Medical History:  Diagnosis Date  . Abscess   . GERD (gastroesophageal reflux disease)      Allergies  Allergen Reactions  . Codeine Hives, Itching and Swelling  . Sulfa Antibiotics Rash     Current Outpatient Medications  Medication Sig Dispense Refill  . metFORMIN (GLUCOPHAGE-XR) 750 MG 24 hr tablet Take 1 tablet (750 mg total) by mouth 3 (three) times daily before meals. 90 tablet 2  . sildenafil (REVATIO) 20 MG tablet Take 1 tablet (20 mg total) by mouth daily as needed (2-5 as needed). 50 tablet 5   No current facility-administered medications for this visit.      Past Surgical History:  Procedure Laterality Date  . INCISE AND DRAIN ABCESS     pilonodil cyst-[facility]  . PILONIDAL CYST EXCISION N/A 11/02/2013   Procedure: CYST EXCISION PILONIDAL SIMPLE;  Surgeon: Jamesetta So, MD;  Location: [facility];  Service: General;  Laterality: N/A;     Allergies  Allergen Reactions  . Codeine Hives, Itching and Swelling  . Sulfa Antibiotics Rash      Family History  Problem Relation Age of Onset  . Hypertension Mother   . Diabetes Father   . Hypertension Father   . Heart disease Father      Social History Steven Tanner reports that he has been smoking cigarettes and cigars. He started smoking about 22 years ago. He has a 20.00 pack-year smoking history. He has never used smokeless tobacco. Steven Tanner reports that he drinks  alcohol.   Review of Systems CONSTITUTIONAL: No weight loss, fever, chills, weakness or fatigue.  HEENT: Eyes: No visual loss, blurred vision, double vision or yellow sclerae.No hearing loss, sneezing, congestion, runny nose or sore throat.  SKIN: No rash or itching.  CARDIOVASCULAR: per hpi RESPIRATORY:per hpi GASTROINTESTINAL: No anorexia, nausea, vomiting or diarrhea. No abdominal pain or blood.  GENITOURINARY: No burning on urination, no polyuria NEUROLOGICAL: No headache, dizziness, syncope, paralysis, ataxia, numbness or tingling in the extremities. No change in bowel or bladder control.  MUSCULOSKELETAL: No muscle, back pain, joint pain or stiffness.  LYMPHATICS: No enlarged nodes. No history of splenectomy.  PSYCHIATRIC: No history of depression or anxiety.  ENDOCRINOLOGIC: No reports of sweating, cold or heat intolerance. No polyuria or polydipsia.  Marland Kitchen   Physical Examination Vitals:   12/11/17 1333  BP: 114/78  Pulse: 98  SpO2: 96%   Vitals:   12/11/17 1333  Weight: 269 lb 12.8 oz (122.4 kg)  Height: 5\' 10"  (1.778 m)    Gen: resting comfortably, no acute distress HEENT: no scleral icterus, pupils equal round and reactive, no palptable cervical adenopathy,  CV: RRR, no m/r/g no jvd Resp: Clear to auscultation bilaterally GI: abdomen is soft, non-tender, non-distended, normal bowel sounds, no hepatosplenomegaly MSK: extremities are warm, no edema.  Skin: warm, no rash Neuro:  no focal deficits Psych: appropriate affect  Assessment and Plan  1. Chest pain - symptoms are mixed for possible ischemia. He does have CAD risk factors including uncontrolled DM2,  - plan for 2 day exercise nuclear stress test - start ASA 81mg  daily, if negative stress test can d/c aspirin  2. DM2 - recent change in metformin by pcp - recommend at least moderate dose statin in setting of DM2, defer to pcp.      Arnoldo Lenis, M.D.

## 2018-01-16 ENCOUNTER — Ambulatory Visit: Payer: Self-pay | Admitting: Family Medicine

## 2018-01-20 ENCOUNTER — Encounter (HOSPITAL_COMMUNITY)
Admission: RE | Admit: 2018-01-20 | Discharge: 2018-01-20 | Disposition: A | Discharge status: Home / Self Care | Payer: Self-pay | Source: Ambulatory Visit | Attending: Family Medicine | Admitting: Family Medicine

## 2018-01-20 ENCOUNTER — Encounter (HOSPITAL_BASED_OUTPATIENT_CLINIC_OR_DEPARTMENT_OTHER)
Admission: RE | Admit: 2018-01-20 | Discharge: 2018-01-20 | Disposition: A | Discharge status: Home / Self Care | Payer: Self-pay | Source: Ambulatory Visit | Attending: Cardiology | Admitting: Cardiology

## 2018-01-20 DIAGNOSIS — R079 Chest pain, unspecified: Secondary | ICD-10-CM

## 2018-01-20 MED ORDER — SODIUM CHLORIDE 0.9% FLUSH
INTRAVENOUS | Status: AC
  Administered 2018-01-20: 10 mL via INTRAVENOUS
  Filled 2018-01-20: qty 10

## 2018-01-20 MED ORDER — TECHNETIUM TC 99M TETROFOSMIN IV KIT
30.0000 | PACK | Freq: Once | INTRAVENOUS | Status: AC | PRN
  Administered 2018-01-20: 29 via INTRAVENOUS

## 2018-01-21 ENCOUNTER — Encounter (HOSPITAL_COMMUNITY): Payer: Self-pay

## 2018-01-21 ENCOUNTER — Encounter (HOSPITAL_COMMUNITY)
Admission: RE | Admit: 2018-01-21 | Discharge: 2018-01-21 | Disposition: A | Discharge status: Home / Self Care | Payer: Self-pay | Source: Ambulatory Visit | Attending: Cardiology | Admitting: Cardiology

## 2018-01-21 LAB — NM MYOCAR MULTI W/SPECT W/WALL MOTION / EF
CHL CUP NUCLEAR SRS: 1
CHL CUP RESTING HR STRESS: 78 {beats}/min
CHL RATE OF PERCEIVED EXERTION: 15
CSEPED: 7 min
CSEPEW: 10.1 METS
CSEPPHR: 160 {beats}/min
Exercise duration (sec): 30 s
LVDIAVOL: 129 mL (ref 62–150)
LVSYSVOL: 76 mL
MPHR: 175 {beats}/min
NUC STRESS TID: 1.13
Percent HR: 91 %
RATE: 0.37
SDS: 0
SSS: 1

## 2018-01-21 MED ORDER — TECHNETIUM TC 99M TETROFOSMIN IV KIT
30.0000 | PACK | Freq: Once | INTRAVENOUS | Status: AC | PRN
  Administered 2018-01-21: 29.3 via INTRAVENOUS

## 2018-01-22 ENCOUNTER — Telehealth: Payer: Self-pay | Admitting: *Deleted

## 2018-01-22 DIAGNOSIS — R9439 Abnormal result of other cardiovascular function study: Secondary | ICD-10-CM

## 2018-01-22 NOTE — Telephone Encounter (Signed)
-----   Message from Arnoldo Lenis, MD sent at 01/21/2018  1:23 PM EST ----- Stress test looks good without significant blockages. It does suggest that the pumping function of his heart may be a little decreased, a better test to evaluate this is an echocardiogram. Please order an echo for chest pain   J BrancH MD

## 2018-01-22 NOTE — Telephone Encounter (Signed)
Pt aware and agreeable to echo - will place orders and forward to schedulers - routed to pcp

## 2018-02-17 ENCOUNTER — Ambulatory Visit (INDEPENDENT_AMBULATORY_CARE_PROVIDER_SITE_OTHER): Payer: Self-pay | Admitting: Family Medicine

## 2018-02-17 ENCOUNTER — Encounter: Payer: Self-pay | Admitting: Family Medicine

## 2018-02-17 VITALS — BP 115/65 | HR 91 | Temp 98.2°F | Ht 70.0 in | Wt 266.0 lb

## 2018-02-17 DIAGNOSIS — N5203 Combined arterial insufficiency and corporo-venous occlusive erectile dysfunction: Secondary | ICD-10-CM

## 2018-02-17 DIAGNOSIS — IMO0001 Reserved for inherently not codable concepts without codable children: Secondary | ICD-10-CM

## 2018-02-17 DIAGNOSIS — E1165 Type 2 diabetes mellitus with hyperglycemia: Secondary | ICD-10-CM

## 2018-02-17 LAB — BAYER DCA HB A1C WAIVED: HB A1C (BAYER DCA - WAIVED): 8.9 % — ABNORMAL HIGH (ref <7.0)

## 2018-02-17 MED ORDER — SILDENAFIL CITRATE 20 MG PO TABS: ORAL_TABLET | ORAL | 5 refills | Status: DC

## 2018-02-17 MED ORDER — METFORMIN HCL ER 750 MG PO TB24: 750.0000 mg | ORAL_TABLET | Freq: Three times a day (TID) | ORAL | 2 refills | Status: DC

## 2018-02-17 NOTE — Progress Notes (Signed)
Subjective:  Patient ID: Steven Tanner, male    DOB: Dec 09, 1970  Age: 47 y.o. MRN: [id number]  CC: Diabetes (3- 6 mo)   HPI TEREK BEE presents forFollow-up of diabetes. Patient checks blood sugar at home.  Usually under 150. INdulged over thanksgiving with ice cream & multiple sodas.  His next postprandial sugar was 351. Patient denies symptoms such as polyuria, polydipsia, excessive hunger, nausea No significant hypoglycemic spells noted. Medications reviewed. Pt reports taking them regularly without complication/adverse reaction being reported today.  Checking feet daily. Eye appointment is due.  Patient was taking sildenafil daily .  Results from that were insufficient.  Has not tried taking 5 per episode as needed. History Machai has a past medical history of Abscess and GERD (gastroesophageal reflux disease).   He has a past surgical history that includes Incise and drain abcess and Pilonidal cyst excision (N/A, 11/02/2013).   His family history includes Diabetes in his father; Heart disease in his father; Hypertension in his father and mother.He reports that he quit smoking about 7 months ago. His smoking use included cigarettes and cigars. He started smoking about 23 years ago. He has a 20.00 pack-year smoking history. He has never used smokeless tobacco. He reports that he drinks alcohol. He reports that he has current or past drug history. Drug: Marijuana.  Current Outpatient Medications on File Prior to Visit  Medication Sig Dispense Refill  . aspirin EC 81 MG tablet Take 1 tablet (81 mg total) by mouth daily. 90 tablet 3   No current facility-administered medications on file prior to visit.     ROS Review of Systems  Constitutional: Negative.   HENT: Negative.   Eyes: Negative for visual disturbance.  Respiratory: Negative for cough and shortness of breath.   Cardiovascular: Negative for chest pain and leg swelling.  Gastrointestinal: Negative for abdominal  pain, diarrhea, nausea and vomiting.  Genitourinary: Negative for difficulty urinating.  Musculoskeletal: Negative for arthralgias and myalgias.  Skin: Negative for rash.  Neurological: Negative for headaches.  Psychiatric/Behavioral: Negative for sleep disturbance.    Objective:  BP 115/65 (BP Location: Left Arm)   Pulse 91   Temp 98.2 F (36.8 C) (Oral)   Ht 5\' 10"  (1.778 m)   Wt 266 lb (120.7 kg)   BMI 38.17 kg/m   BP Readings from Last 3 Encounters:  02/17/18 115/65  12/11/17 114/78  10/09/17 113/70    Wt Readings from Last 3 Encounters:  02/17/18 266 lb (120.7 kg)  12/11/17 269 lb 12.8 oz (122.4 kg)  10/09/17 265 lb (120.2 kg)     Physical Exam  Constitutional: He is oriented to person, place, and time. He appears well-developed and well-nourished. No distress.  HENT:  Head: Normocephalic and atraumatic.  Right Ear: External ear normal.  Left Ear: External ear normal.  Nose: Nose normal.  Mouth/Throat: Oropharynx is clear and moist.  Eyes: Pupils are equal, round, and reactive to light. Conjunctivae and EOM are normal.  Neck: Normal range of motion. Neck supple.  Cardiovascular: Normal rate, regular rhythm and normal heart sounds.  No murmur heard. Pulmonary/Chest: Effort normal and breath sounds normal. No respiratory distress. He has no wheezes. He has no rales.  Abdominal: Soft. There is no tenderness.  Musculoskeletal: Normal range of motion.  Neurological: He is alert and oriented to person, place, and time. He has normal reflexes.  Skin: Skin is warm and dry.  Psychiatric: He has a normal mood and affect. His behavior is normal.  Judgment and thought content normal.      Assessment & Plan:   Lelend was seen today for diabetes.  Diagnoses and all orders for this visit:  Uncontrolled type 2 diabetes mellitus without complication, without long-term current use of insulin (HCC) -     Bayer DCA Hb A1c Waived  Combined arterial insufficiency and  corporo-venous occlusive erectile dysfunction -     sildenafil (REVATIO) 20 MG tablet; Take 2-5 tabs PRN prior to sexual activity -     Lipid panel  Other orders -     metFORMIN (GLUCOPHAGE-XR) 750 MG 24 hr tablet; Take 1 tablet (750 mg total) by mouth 3 (three) times daily before meals.      I have changed Roselyn Bering. Kozma's sildenafil. I am also having him maintain his aspirin EC and metFORMIN.  Meds ordered this encounter  Medications  . sildenafil (REVATIO) 20 MG tablet    Sig: Take 2-5 tabs PRN prior to sexual activity    Dispense:  50 tablet    Refill:  5  . metFORMIN (GLUCOPHAGE-XR) 750 MG 24 hr tablet    Sig: Take 1 tablet (750 mg total) by mouth 3 (three) times daily before meals.    Dispense:  90 tablet    Refill:  2   Trial of sildenafil making sure he takes 2-5 at the time of arousal. Follow-up: Return in about 3 months (around 05/19/2018).  Claretta Fraise, M.D.

## 2018-02-18 ENCOUNTER — Other Ambulatory Visit: Payer: Self-pay | Admitting: Family Medicine

## 2018-02-18 ENCOUNTER — Encounter: Payer: Self-pay | Admitting: Family Medicine

## 2018-02-18 LAB — LIPID PANEL
CHOL/HDL RATIO: 7.4 ratio — AB (ref 0.0–5.0)
CHOLESTEROL TOTAL: 206 mg/dL — AB (ref 100–199)
HDL: 28 mg/dL — ABNORMAL LOW (ref >39)
LDL CALC: 125 mg/dL — AB (ref 0–99)
Triglycerides: 264 mg/dL — ABNORMAL HIGH (ref 0–149)
VLDL Cholesterol Cal: 53 mg/dL — ABNORMAL HIGH (ref 5–40)

## 2018-02-18 MED ORDER — SITAGLIPTIN PHOSPHATE 100 MG PO TABS: 100.0000 mg | ORAL_TABLET | Freq: Every day | ORAL | 2 refills | Status: DC

## 2018-02-19 ENCOUNTER — Encounter: Payer: Self-pay | Admitting: Radiology

## 2018-02-19 ENCOUNTER — Ambulatory Visit (INDEPENDENT_AMBULATORY_CARE_PROVIDER_SITE_OTHER): Payer: Self-pay

## 2018-02-19 ENCOUNTER — Other Ambulatory Visit: Payer: Self-pay

## 2018-02-19 DIAGNOSIS — R9439 Abnormal result of other cardiovascular function study: Secondary | ICD-10-CM

## 2018-02-19 NOTE — Progress Notes (Signed)
Echocardiogram completed.  Patient ID: Steven Tanner, male    DOB: [birth year], 47 y.o.   MRN: [id number]  HPI    Review of Systems    Physical Exam

## 2018-02-25 ENCOUNTER — Telehealth: Payer: Self-pay | Admitting: *Deleted

## 2018-02-25 NOTE — Telephone Encounter (Signed)
-----   Message from Arnoldo Lenis, MD sent at 02/21/2018 12:01 PM EST ----- Echo looks good, normal heart function. F/u 4 months    J BrancH MD

## 2018-02-26 NOTE — Telephone Encounter (Signed)
Notes recorded by Laurine Blazer, LPN on 07/27/209 at 10:46 AM EST Patient notified. Copy to pmd. ------  Notes recorded by Ashworth, Staci T, CMA on 02/25/2018 at 4:21 PM EST LM to return call ------  Notes recorded by Arnoldo Lenis, MD on 02/21/2018 at 12:01 PM EST Echo looks good, normal heart function. F/u 4 months    J BrancH MD

## 2018-04-14 ENCOUNTER — Ambulatory Visit: Payer: Self-pay | Admitting: Family Medicine

## 2018-05-21 ENCOUNTER — Ambulatory Visit (INDEPENDENT_AMBULATORY_CARE_PROVIDER_SITE_OTHER): Payer: Self-pay | Admitting: Family Medicine

## 2018-05-21 ENCOUNTER — Encounter: Payer: Self-pay | Admitting: Family Medicine

## 2018-05-21 VITALS — BP 120/75 | HR 97 | Temp 98.7°F | Ht 70.0 in | Wt 257.5 lb

## 2018-05-21 DIAGNOSIS — E1165 Type 2 diabetes mellitus with hyperglycemia: Secondary | ICD-10-CM

## 2018-05-21 DIAGNOSIS — IMO0001 Reserved for inherently not codable concepts without codable children: Secondary | ICD-10-CM

## 2018-05-21 LAB — CMP14+EGFR
ALBUMIN: 4.2 g/dL (ref 4.0–5.0)
ALK PHOS: 73 IU/L (ref 39–117)
ALT: 22 IU/L (ref 0–44)
AST: 15 IU/L (ref 0–40)
Albumin/Globulin Ratio: 1.6 (ref 1.2–2.2)
BILIRUBIN TOTAL: 0.5 mg/dL (ref 0.0–1.2)
BUN/Creatinine Ratio: 10 (ref 9–20)
BUN: 11 mg/dL (ref 6–24)
CO2: 20 mmol/L (ref 20–29)
Calcium: 9.1 mg/dL (ref 8.7–10.2)
Chloride: 97 mmol/L (ref 96–106)
Creatinine, Ser: 1.09 mg/dL (ref 0.76–1.27)
GFR, EST AFRICAN AMERICAN: 94 mL/min/{1.73_m2} (ref >59)
GFR, EST NON AFRICAN AMERICAN: 82 mL/min/{1.73_m2} (ref >59)
GLOBULIN, TOTAL: 2.7 g/dL (ref 1.5–4.5)
Glucose: 263 mg/dL — ABNORMAL HIGH (ref 65–99)
POTASSIUM: 4.1 mmol/L (ref 3.5–5.2)
Sodium: 133 mmol/L — ABNORMAL LOW (ref 134–144)
Total Protein: 6.9 g/dL (ref 6.0–8.5)

## 2018-05-21 LAB — BAYER DCA HB A1C WAIVED: HB A1C (BAYER DCA - WAIVED): 10.5 % — ABNORMAL HIGH (ref <7.0)

## 2018-05-21 MED ORDER — METFORMIN HCL ER 750 MG PO TB24: 750.0000 mg | ORAL_TABLET | Freq: Three times a day (TID) | ORAL | 5 refills | Status: DC

## 2018-05-21 MED ORDER — SITAGLIPTIN PHOSPHATE 100 MG PO TABS: 100.0000 mg | ORAL_TABLET | Freq: Every day | ORAL | 5 refills | Status: DC

## 2018-05-21 NOTE — Patient Instructions (Signed)
Carbohydrate Counting for Diabetes Mellitus, Adult  Carbohydrate counting is a method of keeping track of how many carbohydrates you eat. Eating carbohydrates naturally increases the amount of sugar (glucose) in the blood. Counting how many carbohydrates you eat helps keep your blood glucose within normal limits, which helps you manage your diabetes (diabetes mellitus). It is important to know how many carbohydrates you can safely have in each meal. This is different for every person. A diet and nutrition specialist (registered dietitian) can help you make a meal plan and calculate how many carbohydrates you should have at each meal and snack. Carbohydrates are found in the following foods:  Grains, such as breads and cereals.  Dried beans and soy products.  Starchy vegetables, such as potatoes, peas, and corn.  Fruit and fruit juices.  Milk and yogurt.  Sweets and snack foods, such as cake, cookies, candy, chips, and soft drinks. How do I count carbohydrates? There are two ways to count carbohydrates in food. You can use either of the methods or a combination of both. Reading "Nutrition Facts" on packaged food The "Nutrition Facts" list is included on the labels of almost all packaged foods and beverages in the [place] It includes:  The serving size.  Information about nutrients in each serving, including the grams (g) of carbohydrate per serving. To use the "Nutrition Facts":  Decide how many servings you will have.  Multiply the number of servings by the number of carbohydrates per serving.  The resulting number is the total amount of carbohydrates that you will be having. Learning standard serving sizes of other foods When you eat carbohydrate foods that are not packaged or do not include "Nutrition Facts" on the label, you need to measure the servings in order to count the amount of carbohydrates:  Measure the foods that you will eat with a food scale or measuring cup, if needed.   Decide how many standard-size servings you will eat.  Multiply the number of servings by 15. Most carbohydrate-rich foods have about 15 g of carbohydrates per serving. ? For example, if you eat 8 oz (170 g) of strawberries, you will have eaten 2 servings and 30 g of carbohydrates (2 servings x 15 g = 30 g).  For foods that have more than one food mixed, such as soups and casseroles, you must count the carbohydrates in each food that is included. The following list contains standard serving sizes of common carbohydrate-rich foods. Each of these servings has about 15 g of carbohydrates:   hamburger bun or  English muffin.   oz (15 mL) syrup.   oz (14 g) jelly.  1 slice of bread.  1 six-inch tortilla.  3 oz (85 g) cooked rice or pasta.  4 oz (113 g) cooked dried beans.  4 oz (113 g) starchy vegetable, such as peas, corn, or potatoes.  4 oz (113 g) hot cereal.  4 oz (113 g) mashed potatoes or  of a large baked potato.  4 oz (113 g) canned or frozen fruit.  4 oz (120 mL) fruit juice.  4-6 crackers.  6 chicken nuggets.  6 oz (170 g) unsweetened dry cereal.  6 oz (170 g) plain fat-free yogurt or yogurt sweetened with artificial sweeteners.  8 oz (240 mL) milk.  8 oz (170 g) fresh fruit or one small piece of fruit.  24 oz (680 g) popped popcorn. Example of carbohydrate counting Sample meal  3 oz (85 g) chicken breast.  6 oz (170 g)   brown rice.  4 oz (113 g) corn.  8 oz (240 mL) milk.  8 oz (170 g) strawberries with sugar-free whipped topping. Carbohydrate calculation 1. Identify the foods that contain carbohydrates: ? Rice. ? Corn. ? Milk. ? Strawberries. 2. Calculate how many servings you have of each food: ? 2 servings rice. ? 1 serving corn. ? 1 serving milk. ? 1 serving strawberries. 3. Multiply each number of servings by 15 g: ? 2 servings rice x 15 g = 30 g. ? 1 serving corn x 15 g = 15 g. ? 1 serving milk x 15 g = 15 g. ? 1 serving  strawberries x 15 g = 15 g. 4. Add together all of the amounts to find the total grams of carbohydrates eaten: ? 30 g + 15 g + 15 g + 15 g = 75 g of carbohydrates total. Summary  Carbohydrate counting is a method of keeping track of how many carbohydrates you eat.  Eating carbohydrates naturally increases the amount of sugar (glucose) in the blood.  Counting how many carbohydrates you eat helps keep your blood glucose within normal limits, which helps you manage your diabetes.  A diet and nutrition specialist (registered dietitian) can help you make a meal plan and calculate how many carbohydrates you should have at each meal and snack. This information is not intended to replace advice given to you by your health care provider. Make sure you discuss any questions you have with your health care provider. Document Released: 03/05/2005 Document Revised: 09/12/2016 Document Reviewed: 08/17/2015 Elsevier Interactive Patient Education  2019 Elsevier Inc[employer]  

## 2018-05-26 ENCOUNTER — Encounter: Payer: Self-pay | Admitting: Family Medicine

## 2018-05-26 NOTE — Progress Notes (Signed)
Subjective:  Patient ID: Steven Tanner, male    DOB: 1970-12-03  Age: 48 y.o. MRN: [id number]  CC: Medical Management of Chronic Issues   HPI Steven Tanner presents forFollow-up of diabetes. Patient admits being off track and not checking Steven Tanner blood sugar or eating right or exercising.  Patient denies symptoms such as polyuria, polydipsia, excessive hunger, nausea No significant hypoglycemic spells noted. Medications reviewed. Pt reports taking them regularly without complication/adverse reaction being reported today.  Eye appointment overdue  History Steven Tanner has a past medical history of Abscess and GERD (gastroesophageal reflux disease).   Steven Tanner has a past surgical history that includes Incise and drain abcess and Pilonidal cyst excision (N/A, 11/02/2013).   Steven Tanner family history includes Diabetes in Steven Tanner father; Heart disease in Steven Tanner father; Hypertension in Steven Tanner father and mother.Steven Tanner reports that Steven Tanner quit smoking about 10 months ago. Steven Tanner smoking use included cigarettes and cigars. Steven Tanner started smoking about 23 years ago. Steven Tanner has a 20.00 pack-year smoking history. Steven Tanner has never used smokeless tobacco. Steven Tanner reports current alcohol use. Steven Tanner reports current drug use. Drug: Marijuana.  Current Outpatient Medications on File Prior to Visit  Medication Sig Dispense Refill  . aspirin EC 81 MG tablet Take 1 tablet (81 mg total) by mouth daily. 90 tablet 3  . sildenafil (REVATIO) 20 MG tablet Take 2-5 tabs PRN prior to sexual activity 50 tablet 5   No current facility-administered medications on file prior to visit.     ROS Review of Systems  Constitutional: Negative.   HENT: Negative.   Eyes: Negative for visual disturbance.  Respiratory: Negative for cough and shortness of breath.   Cardiovascular: Negative for chest pain and leg swelling.  Gastrointestinal: Negative for abdominal pain, diarrhea, nausea and vomiting.  Genitourinary: Negative for difficulty urinating.  Musculoskeletal: Negative for  arthralgias and myalgias.  Skin: Negative for rash.  Neurological: Negative for headaches.  Psychiatric/Behavioral: Negative for sleep disturbance.    Objective:  BP 120/75   Pulse 97   Temp 98.7 F (37.1 C) (Oral)   Ht _0  (1.778 m)   Wt 257 lb 8 oz (116.8 kg)   BMI 36.95 kg/m   BP Readings from Last 3 Encounters:  05/21/18 120/75  02/17/18 115/65  12/11/17 114/78    Wt Readings from Last 3 Encounters:  05/21/18 257 lb 8 oz (116.8 kg)  02/17/18 266 lb (120.7 kg)  12/11/17 269 lb 12.8 oz (122.4 kg)     Physical Exam Constitutional:      General: Steven Tanner is not in acute distress.    Appearance: Steven Tanner is well-developed.  HENT:     Head: Normocephalic and atraumatic.     Right Ear: External ear normal.     Left Ear: External ear normal.     Nose: Nose normal.  Eyes:     Conjunctiva/sclera: Conjunctivae normal.     Pupils: Pupils are equal, round, and reactive to light.  Neck:     Musculoskeletal: Normal range of motion and neck supple.  Cardiovascular:     Rate and Rhythm: Normal rate and regular rhythm.     Heart sounds: Normal heart sounds. No murmur.  Pulmonary:     Effort: Pulmonary effort is normal. No respiratory distress.     Breath sounds: Normal breath sounds. No wheezing or rales.  Abdominal:     Palpations: Abdomen is soft.     Tenderness: There is no abdominal tenderness.  Musculoskeletal: Normal range of motion.  Skin:  General: Skin is warm and dry.  Neurological:     Mental Status: Steven Tanner is alert and oriented to person, place, and time.     Deep Tendon Reflexes: Reflexes are normal and symmetric.  Psychiatric:        Behavior: Behavior normal.        Thought Content: Thought content normal.        Judgment: Judgment normal.       Assessment & Plan:   Dink was seen today for medical management of chronic issues.  Diagnoses and all orders for this visit:  Uncontrolled type 2 diabetes mellitus without complication, without long-term current  use of insulin (HCC) -     CMP14+EGFR -     Bayer DCA Hb A1c Waived  Other orders -     metFORMIN (GLUCOPHAGE-XR) 750 MG 24 hr tablet; Take 1 tablet (750 mg total) by mouth 3 (three) times daily before meals. -     sitaGLIPtin (JANUVIA) 100 MG tablet; Take 1 tablet (100 mg total) by mouth daily.    Carbohydrate counting diet for diabetic handout given.  Patient encouraged to exercise regularly follow Steven Tanner diet and take medicines as noted below.  I am having Al Decant maintain Steven Tanner aspirin EC, sildenafil, metFORMIN, and sitaGLIPtin.  Meds ordered this encounter  Medications  . metFORMIN (GLUCOPHAGE-XR) 750 MG 24 hr tablet    Sig: Take 1 tablet (750 mg total) by mouth 3 (three) times daily before meals.    Dispense:  90 tablet    Refill:  5  . sitaGLIPtin (JANUVIA) 100 MG tablet    Sig: Take 1 tablet (100 mg total) by mouth daily.    Dispense:  30 tablet    Refill:  5     Follow-up: Return in about 3 months (around 08/21/2018).  Claretta Fraise, M.D.

## 2018-08-22 ENCOUNTER — Ambulatory Visit: Payer: Self-pay | Admitting: Family Medicine

## 2018-09-15 ENCOUNTER — Other Ambulatory Visit: Payer: Self-pay

## 2018-09-16 ENCOUNTER — Ambulatory Visit (INDEPENDENT_AMBULATORY_CARE_PROVIDER_SITE_OTHER): Payer: Self-pay | Admitting: Family Medicine

## 2018-09-16 ENCOUNTER — Telehealth: Payer: Self-pay | Admitting: Family Medicine

## 2018-09-16 ENCOUNTER — Encounter: Payer: Self-pay | Admitting: Family Medicine

## 2018-09-16 ENCOUNTER — Other Ambulatory Visit: Payer: Self-pay

## 2018-09-16 VITALS — BP 124/77 | HR 90 | Temp 97.3°F | Ht 70.0 in | Wt 266.0 lb

## 2018-09-16 DIAGNOSIS — N5203 Combined arterial insufficiency and corporo-venous occlusive erectile dysfunction: Secondary | ICD-10-CM

## 2018-09-16 DIAGNOSIS — IMO0001 Reserved for inherently not codable concepts without codable children: Secondary | ICD-10-CM

## 2018-09-16 DIAGNOSIS — E1165 Type 2 diabetes mellitus with hyperglycemia: Secondary | ICD-10-CM

## 2018-09-16 LAB — BAYER DCA HB A1C WAIVED: HB A1C (BAYER DCA - WAIVED): 7.2 % — ABNORMAL HIGH (ref <7.0)

## 2018-09-16 MED ORDER — GLIMEPIRIDE 2 MG PO TABS: 2.0000 mg | ORAL_TABLET | Freq: Every day | ORAL | 3 refills | Status: DC

## 2018-09-16 MED ORDER — METFORMIN HCL ER 750 MG PO TB24: 750.0000 mg | ORAL_TABLET | Freq: Three times a day (TID) | ORAL | 1 refills | Status: DC

## 2018-09-16 MED ORDER — SILDENAFIL CITRATE 20 MG PO TABS: ORAL_TABLET | ORAL | 5 refills | Status: DC

## 2018-09-16 MED ORDER — SITAGLIPTIN PHOSPHATE 100 MG PO TABS: 100.0000 mg | ORAL_TABLET | Freq: Every day | ORAL | 1 refills | Status: DC

## 2018-09-16 MED ORDER — METFORMIN HCL ER 750 MG PO TB24: 750.0000 mg | ORAL_TABLET | Freq: Two times a day (BID) | ORAL | 1 refills | Status: DC

## 2018-09-16 NOTE — Progress Notes (Signed)
Subjective:  Patient ID: Steven Tanner, male    DOB: May 21, 1970  Age: 48 y.o. MRN: [id number]  CC: Medical Management of Chronic Issues   HPI Steven Tanner presents forFollow-up of diabetes. Patient checks blood sugar at home.  Usually around 140 random. Occasionally up to 230. Patient denies symptoms such as polyuria, polydipsia, excessive hunger, nausea No significant hypoglycemic spells noted. Medications reviewed. Can not afford Januvia, it will be $1000 for him. He stopped the metformin when he started the Januvia. Checking feet daily.   History Steven Tanner has a past medical history of Abscess and GERD (gastroesophageal reflux disease).   He has a past surgical history that includes Incise and drain abcess and Pilonidal cyst excision (N/A, 11/02/2013).   His family history includes Diabetes in his father; Heart disease in his father; Hypertension in his father and mother.He reports that he quit smoking about 14 months ago. His smoking use included cigarettes and cigars. He started smoking about 23 years ago. He has a 20.00 pack-year smoking history. He has never used smokeless tobacco. He reports current alcohol use. He reports current drug use. Drug: Marijuana.  Current Outpatient Medications on File Prior to Visit  Medication Sig Dispense Refill  . aspirin EC 81 MG tablet Take 1 tablet (81 mg total) by mouth daily. (Patient not taking: Reported on 09/16/2018) 90 tablet 3   No current facility-administered medications on file prior to visit.     ROS Review of Systems  Constitutional: Negative.   HENT: Negative.   Eyes: Negative for visual disturbance.  Respiratory: Negative for cough and shortness of breath.   Cardiovascular: Negative for chest pain and leg swelling.  Gastrointestinal: Negative for abdominal pain, diarrhea, nausea and vomiting.  Genitourinary: Negative for difficulty urinating.  Musculoskeletal: Negative for arthralgias and myalgias.  Skin: Negative for  rash.  Neurological: Negative for headaches.  Psychiatric/Behavioral: Negative for sleep disturbance.    Objective:  BP 124/77   Pulse 90   Temp (!) 97.3 F (36.3 C) (Oral)   Ht _0  (1.778 m)   Wt 266 lb (120.7 kg)   BMI 38.17 kg/m   BP Readings from Last 3 Encounters:  09/16/18 124/77  05/21/18 120/75  02/17/18 115/65    Wt Readings from Last 3 Encounters:  09/16/18 266 lb (120.7 kg)  05/21/18 257 lb 8 oz (116.8 kg)  02/17/18 266 lb (120.7 kg)     Physical Exam Constitutional:      General: He is not in acute distress.    Appearance: He is well-developed.  HENT:     Head: Normocephalic and atraumatic.     Right Ear: External ear normal.     Left Ear: External ear normal.     Nose: Nose normal.  Eyes:     Conjunctiva/sclera: Conjunctivae normal.     Pupils: Pupils are equal, round, and reactive to light.  Neck:     Musculoskeletal: Normal range of motion and neck supple.  Cardiovascular:     Rate and Rhythm: Regular rhythm. Tachycardia present.     Heart sounds: Normal heart sounds. No murmur.  Pulmonary:     Effort: Pulmonary effort is normal. No respiratory distress.     Breath sounds: Normal breath sounds. No wheezing or rales.  Abdominal:     Palpations: Abdomen is soft.     Tenderness: There is no abdominal tenderness.  Musculoskeletal: Normal range of motion.  Skin:    General: Skin is warm and dry.  Neurological:  Mental Status: He is alert and oriented to person, place, and time.     Deep Tendon Reflexes: Reflexes are normal and symmetric.  Psychiatric:        Behavior: Behavior normal.        Thought Content: Thought content normal.        Judgment: Judgment normal.       Assessment & Plan:   Steven Tanner was seen today for medical management of chronic issues.  Diagnoses and all orders for this visit:  Uncontrolled type 2 diabetes mellitus without complication, without long-term current use of insulin (HCC) -     CMP14+EGFR -      Bayer DCA Hb A1c Waived  Combined arterial insufficiency and corporo-venous occlusive erectile dysfunction -     sildenafil (REVATIO) 20 MG tablet; Take 2-5 tabs PRN prior to sexual activity  Other orders -     Discontinue: sitaGLIPtin (JANUVIA) 100 MG tablet; Take 1 tablet (100 mg total) by mouth daily. -     Discontinue: metFORMIN (GLUCOPHAGE-XR) 750 MG 24 hr tablet; Take 1 tablet (750 mg total) by mouth 3 (three) times daily before meals. -     metFORMIN (GLUCOPHAGE-XR) 750 MG 24 hr tablet; Take 1 tablet (750 mg total) by mouth 2 (two) times daily. -     glimepiride (AMARYL) 2 MG tablet; Take 1 tablet (2 mg total) by mouth daily before breakfast.      I have discontinued Roselyn Bering. Bartnick's metFORMIN, sitaGLIPtin, and sitaGLIPtin. I have also changed his metFORMIN. Additionally, I am having him start on glimepiride. Lastly, I am having him maintain his aspirin EC and sildenafil.  Meds ordered this encounter  Medications  . DISCONTD: sitaGLIPtin (JANUVIA) 100 MG tablet    Sig: Take 1 tablet (100 mg total) by mouth daily.    Dispense:  90 tablet    Refill:  1  . DISCONTD: metFORMIN (GLUCOPHAGE-XR) 750 MG 24 hr tablet    Sig: Take 1 tablet (750 mg total) by mouth 3 (three) times daily before meals.    Dispense:  270 tablet    Refill:  1  . sildenafil (REVATIO) 20 MG tablet    Sig: Take 2-5 tabs PRN prior to sexual activity    Dispense:  50 tablet    Refill:  5  . metFORMIN (GLUCOPHAGE-XR) 750 MG 24 hr tablet    Sig: Take 1 tablet (750 mg total) by mouth 2 (two) times daily.    Dispense:  270 tablet    Refill:  1  . glimepiride (AMARYL) 2 MG tablet    Sig: Take 1 tablet (2 mg total) by mouth daily before breakfast.    Dispense:  30 tablet    Refill:  3     Follow-up: Return in about 3 months (around 12/17/2018).  Claretta Fraise, M.D.

## 2018-09-16 NOTE — Telephone Encounter (Signed)
Please contact the patient . There isn't anything quite as good. His A1c came back much improved at 7.2. We can get close  if we add back the metformin  Twice a day and add glimepiride to that once a day.

## 2018-09-17 ENCOUNTER — Ambulatory Visit: Payer: Self-pay | Admitting: Family Medicine

## 2018-09-17 LAB — CMP14+EGFR
ALT: 25 IU/L (ref 0–44)
AST: 17 IU/L (ref 0–40)
Albumin/Globulin Ratio: 1.6 (ref 1.2–2.2)
Albumin: 4.3 g/dL (ref 4.0–5.0)
Alkaline Phosphatase: 58 IU/L (ref 39–117)
BUN/Creatinine Ratio: 13 (ref 9–20)
BUN: 13 mg/dL (ref 6–24)
Bilirubin Total: 0.2 mg/dL (ref 0.0–1.2)
CO2: 22 mmol/L (ref 20–29)
Calcium: 9.3 mg/dL (ref 8.7–10.2)
Chloride: 105 mmol/L (ref 96–106)
Creatinine, Ser: 1.03 mg/dL (ref 0.76–1.27)
GFR calc Af Amer: 100 mL/min/{1.73_m2} (ref >59)
GFR calc non Af Amer: 87 mL/min/{1.73_m2} (ref >59)
Globulin, Total: 2.7 g/dL (ref 1.5–4.5)
Glucose: 182 mg/dL — ABNORMAL HIGH (ref 65–99)
Potassium: 4.8 mmol/L (ref 3.5–5.2)
Sodium: 140 mmol/L (ref 134–144)
Total Protein: 7 g/dL (ref 6.0–8.5)

## 2018-09-17 NOTE — Telephone Encounter (Signed)
Spoke to pt and he was excited his A1C has improved that much but states he can't afford the Januvia so will be ok with the metformin and glimiperide. Pt aware it is sent into pharmacy.

## 2018-09-17 NOTE — Progress Notes (Signed)
Hello Alphonza,  Your lab result is normal.Some minor variations that are not significant are commonly marked abnormal, but do not represent any medical problem for you.  Best regards, Claretta Fraise, M.D.

## 2018-09-18 ENCOUNTER — Telehealth: Payer: Self-pay | Admitting: Family Medicine

## 2018-09-18 NOTE — Telephone Encounter (Signed)
Pt called and aware of rx sent to pharm on 6/30.

## 2018-09-18 NOTE — Telephone Encounter (Signed)
Per last OV - janumet was stopped and other meds started   LM for pt  jhb 09/18/18.

## 2018-12-17 ENCOUNTER — Other Ambulatory Visit: Payer: Self-pay

## 2018-12-18 ENCOUNTER — Ambulatory Visit (INDEPENDENT_AMBULATORY_CARE_PROVIDER_SITE_OTHER): Payer: Self-pay | Admitting: Family Medicine

## 2018-12-18 ENCOUNTER — Encounter: Payer: Self-pay | Admitting: Family Medicine

## 2018-12-18 VITALS — BP 112/69 | HR 91 | Temp 96.2°F | Ht 70.0 in | Wt 272.0 lb

## 2018-12-18 DIAGNOSIS — E119 Type 2 diabetes mellitus without complications: Secondary | ICD-10-CM

## 2018-12-18 LAB — BAYER DCA HB A1C WAIVED: HB A1C (BAYER DCA - WAIVED): 8.2 % — ABNORMAL HIGH (ref <7.0)

## 2018-12-18 MED ORDER — METFORMIN HCL 1000 MG PO TABS: 1000.0000 mg | ORAL_TABLET | Freq: Two times a day (BID) | ORAL | 3 refills | Status: DC

## 2018-12-18 MED ORDER — GLIMEPIRIDE 4 MG PO TABS: 4.0000 mg | ORAL_TABLET | Freq: Every day | ORAL | 2 refills | Status: DC

## 2018-12-18 NOTE — Progress Notes (Signed)
Subjective:  Patient ID: Steven Tanner, male    DOB: 1971/03/13  Age: 48 y.o. MRN: [id number]  CC: Diabetes (3 month )   HPI Steven Tanner presents for presents forFollow-up of diabetes. Patient checks blood sugar at home rarely.  Has started drinking more sodas and eating more carbs. The metformin got more expensive so he quit taking it.  Patient denies symptoms such as polyuria, polydipsia, excessive hunger, nausea No significant hypoglycemic spells noted. Medications reviewed. Pt reports taking them regularly without complication/adverse reaction being reported today.  Checking feet daily. Last eye appt was 1-2 weeks ago. Good report. No retinopathy.   Depression screen [facility] 2/9 12/18/2018 09/16/2018 05/21/2018  Decreased Interest 0 0 1  Down, Depressed, Hopeless 0 0 0  PHQ - 2 Score 0 0 1    History Steven Tanner has a past medical history of Abscess, Depression, and GERD (gastroesophageal reflux disease).   He has a past surgical history that includes Incise and drain abcess and Pilonidal cyst excision (N/A, 11/02/2013).   His family history includes Diabetes in his father; Heart disease in his father; Hypertension in his father and mother.He reports that he quit smoking about 17 months ago. His smoking use included cigarettes and cigars. He started smoking about 23 years ago. He has a 20.00 pack-year smoking history. He has never used smokeless tobacco. He reports current alcohol use. He reports current drug use. Drug: Marijuana.    ROS Review of Systems  Constitutional: Negative.   HENT: Negative.   Eyes: Negative for visual disturbance.  Respiratory: Negative for cough and shortness of breath.   Cardiovascular: Negative for chest pain and leg swelling.  Gastrointestinal: Negative for abdominal pain, diarrhea, nausea and vomiting.  Genitourinary: Negative for difficulty urinating.  Musculoskeletal: Negative for arthralgias and myalgias.  Skin: Negative for rash.   Neurological: Negative for headaches.  Psychiatric/Behavioral: Negative for sleep disturbance.    Objective:  BP 112/69   Pulse 91   Temp (!) 96.2 F (35.7 C) (Temporal)   Ht '5\' 10"'$  (1.778 m)   Wt 272 lb (123.4 kg)   SpO2 99%   BMI 39.03 kg/m   BP Readings from Last 3 Encounters:  12/18/18 112/69  09/16/18 124/77  05/21/18 120/75    Wt Readings from Last 3 Encounters:  12/18/18 272 lb (123.4 kg)  09/16/18 266 lb (120.7 kg)  05/21/18 257 lb 8 oz (116.8 kg)     Physical Exam Constitutional:      General: He is not in acute distress.    Appearance: He is well-developed. He is obese.  HENT:     Head: Normocephalic and atraumatic.     Right Ear: External ear normal.     Left Ear: External ear normal.     Nose: Nose normal.  Eyes:     Conjunctiva/sclera: Conjunctivae normal.     Pupils: Pupils are equal, round, and reactive to light.  Neck:     Musculoskeletal: Normal range of motion and neck supple.  Cardiovascular:     Rate and Rhythm: Normal rate and regular rhythm.     Heart sounds: Normal heart sounds. No murmur.  Pulmonary:     Effort: Pulmonary effort is normal. No respiratory distress.     Breath sounds: Normal breath sounds. No wheezing or rales.  Abdominal:     Palpations: Abdomen is soft.     Tenderness: There is no abdominal tenderness.  Musculoskeletal: Normal range of motion.  Skin:    General: Skin  is warm and dry.  Neurological:     Mental Status: He is alert and oriented to person, place, and time.     Deep Tendon Reflexes: Reflexes are normal and symmetric.  Psychiatric:        Behavior: Behavior normal.        Thought Content: Thought content normal.        Judgment: Judgment normal.       Assessment & Plan:   Steven Tanner was seen today for diabetes.  Diagnoses and all orders for this visit:  Diabetes mellitus without complication ([place]) -     Bayer DCA Hb A1c Waived -     Lipid panel -     CMP14+EGFR -     Microalbumin / creatinine  urine ratio  Other orders -     glimepiride (AMARYL) 4 MG tablet; Take 1 tablet (4 mg total) by mouth daily before breakfast. -     metFORMIN (GLUCOPHAGE) 1000 MG tablet; Take 1 tablet (1,000 mg total) by mouth 2 (two) times daily with a meal.       I have discontinued Roselyn Bering. Swatek's metFORMIN. I have also changed his glimepiride. Additionally, I am having him start on metFORMIN. Lastly, I am having him maintain his aspirin EC and sildenafil.  Allergies as of 12/18/2018      Reactions   Codeine Hives, Itching, Swelling   Sulfa Antibiotics Rash      Medication List       Accurate as of December 18, 2018  2:44 PM. If you have any questions, ask your nurse or doctor.        STOP taking these medications   metFORMIN 750 MG 24 hr tablet Commonly known as: GLUCOPHAGE-XR Replaced by: metFORMIN 1000 MG tablet Stopped by: Claretta Fraise, MD     TAKE these medications   aspirin EC 81 MG tablet Take 1 tablet (81 mg total) by mouth daily.   glimepiride 4 MG tablet Commonly known as: AMARYL Take 1 tablet (4 mg total) by mouth daily before breakfast. What changed:   medication strength  how much to take Changed by: Claretta Fraise, MD   metFORMIN 1000 MG tablet Commonly known as: GLUCOPHAGE Take 1 tablet (1,000 mg total) by mouth 2 (two) times daily with a meal. Replaces: metFORMIN 750 MG 24 hr tablet Started by: Claretta Fraise, MD   sildenafil 20 MG tablet Commonly known as: REVATIO Take 2-5 tabs PRN prior to sexual activity      PT. Encouraged to resume carb controls. I changed his metformin to regular release to help him with cost. I advised him to monitor for GI disturbance. Glimiperide was increased as well.  Follow-up: Return in about 3 months (around 03/20/2019).  Claretta Fraise, M.D.

## 2018-12-19 LAB — CMP14+EGFR
ALT: 26 IU/L (ref 0–44)
AST: 15 IU/L (ref 0–40)
Albumin/Globulin Ratio: 1.9 (ref 1.2–2.2)
Albumin: 4.1 g/dL (ref 4.0–5.0)
Alkaline Phosphatase: 62 IU/L (ref 39–117)
BUN/Creatinine Ratio: 13 (ref 9–20)
BUN: 15 mg/dL (ref 6–24)
Bilirubin Total: 0.2 mg/dL (ref 0.0–1.2)
CO2: 21 mmol/L (ref 20–29)
Calcium: 9.2 mg/dL (ref 8.7–10.2)
Chloride: 107 mmol/L — ABNORMAL HIGH (ref 96–106)
Creatinine, Ser: 1.12 mg/dL (ref 0.76–1.27)
GFR calc Af Amer: 91 mL/min/{1.73_m2} (ref >59)
GFR calc non Af Amer: 78 mL/min/{1.73_m2} (ref >59)
Globulin, Total: 2.2 g/dL (ref 1.5–4.5)
Glucose: 174 mg/dL — ABNORMAL HIGH (ref 65–99)
Potassium: 4.3 mmol/L (ref 3.5–5.2)
Sodium: 139 mmol/L (ref 134–144)
Total Protein: 6.3 g/dL (ref 6.0–8.5)

## 2018-12-19 LAB — LIPID PANEL
Chol/HDL Ratio: 7.1 ratio — ABNORMAL HIGH (ref 0.0–5.0)
Cholesterol, Total: 200 mg/dL — ABNORMAL HIGH (ref 100–199)
HDL: 28 mg/dL — ABNORMAL LOW (ref >39)
LDL Chol Calc (NIH): 141 mg/dL — ABNORMAL HIGH (ref 0–99)
Triglycerides: 171 mg/dL — ABNORMAL HIGH (ref 0–149)
VLDL Cholesterol Cal: 31 mg/dL (ref 5–40)

## 2018-12-22 ENCOUNTER — Other Ambulatory Visit: Payer: Self-pay | Admitting: *Deleted

## 2018-12-22 MED ORDER — ATORVASTATIN CALCIUM 40 MG PO TABS: 40.0000 mg | ORAL_TABLET | Freq: Every day | ORAL | 3 refills | Status: DC

## 2019-03-23 ENCOUNTER — Other Ambulatory Visit: Payer: Self-pay

## 2019-03-24 ENCOUNTER — Ambulatory Visit (INDEPENDENT_AMBULATORY_CARE_PROVIDER_SITE_OTHER): Payer: No Typology Code available for payment source | Admitting: Family Medicine

## 2019-03-24 ENCOUNTER — Encounter: Payer: Self-pay | Admitting: Family Medicine

## 2019-03-24 ENCOUNTER — Other Ambulatory Visit: Payer: Self-pay | Admitting: *Deleted

## 2019-03-24 VITALS — BP 105/67 | HR 112 | Temp 98.6°F | Ht 70.0 in | Wt 272.2 lb

## 2019-03-24 DIAGNOSIS — E119 Type 2 diabetes mellitus without complications: Secondary | ICD-10-CM

## 2019-03-24 DIAGNOSIS — E782 Mixed hyperlipidemia: Secondary | ICD-10-CM | POA: Diagnosis not present

## 2019-03-24 DIAGNOSIS — N5203 Combined arterial insufficiency and corporo-venous occlusive erectile dysfunction: Secondary | ICD-10-CM | POA: Diagnosis not present

## 2019-03-24 LAB — BAYER DCA HB A1C WAIVED: HB A1C (BAYER DCA - WAIVED): 7.1 % — ABNORMAL HIGH (ref <7.0)

## 2019-04-01 ENCOUNTER — Encounter: Payer: Self-pay | Admitting: Family Medicine

## 2019-04-01 DIAGNOSIS — E782 Mixed hyperlipidemia: Secondary | ICD-10-CM | POA: Insufficient documentation

## 2019-04-01 MED ORDER — ATORVASTATIN CALCIUM 40 MG PO TABS: 40.0000 mg | ORAL_TABLET | Freq: Every day | ORAL | 3 refills | Status: DC

## 2019-04-01 MED ORDER — METFORMIN HCL 1000 MG PO TABS: 1000.0000 mg | ORAL_TABLET | Freq: Two times a day (BID) | ORAL | 3 refills | Status: DC

## 2019-04-01 MED ORDER — GLIMEPIRIDE 4 MG PO TABS: 4.0000 mg | ORAL_TABLET | Freq: Every day | ORAL | 2 refills | Status: DC

## 2019-04-01 MED ORDER — SILDENAFIL CITRATE 20 MG PO TABS: ORAL_TABLET | ORAL | 5 refills | Status: DC

## 2019-04-01 NOTE — Progress Notes (Signed)
Subjective:  Patient ID: Steven Tanner,  male    DOB: April 03, 1970  Age: 49 y.o.    CC: Medical Management of Chronic Issues and Diabetes   HPI Steven Tanner presents for  follow-up of hypertension. Patient has no history of headache chest pain or shortness of breath or recent cough. Patient also denies symptoms of TIA such as numbness weakness lateralizing. Patient denies side effects from medication. States taking it regularly.  Patient also  in for follow-up of elevated cholesterol. Doing well without complaints on current medication. Denies side effects  including myalgia and arthralgia and nausea. Also in today for liver function testing. Currently no chest pain, shortness of breath or other cardiovascular related symptoms noted.  Follow-up of diabetes. Patient does check blood sugar at home. Readings run between 100 and 150 Patient denies symptoms such as excessive hunger or urinary frequency, excessive hunger, nausea No significant hypoglycemic spells noted. Medications reviewed. Pt reports taking them regularly. Pt. denies complication/adverse reaction today.    History Steven Tanner has a past medical history of Abscess, Depression, and GERD (gastroesophageal reflux disease).   He has a past surgical history that includes Incise and drain abcess and Pilonidal cyst excision (N/A, 11/02/2013).   His family history includes Diabetes in his father; Heart disease in his father; Hypertension in his father and mother.He reports that he quit smoking about 20 months ago. His smoking use included cigarettes and cigars. He started smoking about 24 years ago. He has a 20.00 pack-year smoking history. He has never used smokeless tobacco. He reports current alcohol use. He reports current drug use. Drug: Marijuana.  Current Outpatient Medications on File Prior to Visit  Medication Sig Dispense Refill  . aspirin EC 81 MG tablet Take 1 tablet (81 mg total) by mouth daily. 90 tablet 3   No current  facility-administered medications on file prior to visit.    ROS Review of Systems  Constitutional: Negative.   HENT: Negative.   Eyes: Negative for visual disturbance.  Respiratory: Negative for cough and shortness of breath.   Cardiovascular: Negative for chest pain and leg swelling.  Gastrointestinal: Negative for abdominal pain, diarrhea, nausea and vomiting.  Genitourinary: Negative for difficulty urinating.  Musculoskeletal: Negative for arthralgias and myalgias.  Skin: Negative for rash.  Neurological: Negative for headaches.  Psychiatric/Behavioral: Negative for sleep disturbance.    Objective:  BP 105/67   Pulse (!) 112   Temp 98.6 F (37 C) (Temporal)   Ht 5\' 10"  (1.778 m)   Wt 272 lb 3.2 oz (123.5 kg)   SpO2 98%   BMI 39.06 kg/m   BP Readings from Last 3 Encounters:  03/24/19 105/67  12/18/18 112/69  09/16/18 124/77    Wt Readings from Last 3 Encounters:  03/24/19 272 lb 3.2 oz (123.5 kg)  12/18/18 272 lb (123.4 kg)  09/16/18 266 lb (120.7 kg)     Physical Exam Constitutional:      General: He is not in acute distress.    Appearance: He is well-developed.  HENT:     Head: Normocephalic and atraumatic.     Right Ear: External ear normal.     Left Ear: External ear normal.     Nose: Nose normal.  Eyes:     Conjunctiva/sclera: Conjunctivae normal.     Pupils: Pupils are equal, round, and reactive to light.  Cardiovascular:     Rate and Rhythm: Normal rate and regular rhythm.     Heart sounds: Normal heart sounds.  No murmur.  Pulmonary:     Effort: Pulmonary effort is normal. No respiratory distress.     Breath sounds: Normal breath sounds. No wheezing or rales.  Abdominal:     Palpations: Abdomen is soft.     Tenderness: There is no abdominal tenderness.  Musculoskeletal:        General: Normal range of motion.     Cervical back: Normal range of motion and neck supple.  Skin:    General: Skin is warm and dry.  Neurological:     Mental  Status: He is alert and oriented to person, place, and time.     Deep Tendon Reflexes: Reflexes are normal and symmetric.  Psychiatric:        Behavior: Behavior normal.        Thought Content: Thought content normal.        Judgment: Judgment normal.    Results for orders placed or performed in visit on 03/24/19  hgba1c  Result Value Ref Range   HB A1C (BAYER DCA - WAIVED) 7.1 (H) <7.0 %     Assessment & Plan:   Steven Tanner was seen today for medical management of chronic issues and diabetes.  Diagnoses and all orders for this visit:  Mixed hyperlipidemia -     atorvastatin (LIPITOR) 40 MG tablet; Take 1 tablet (40 mg total) by mouth daily.  Diabetes mellitus without complication (HCC) -     hgba1c -     glimepiride (AMARYL) 4 MG tablet; Take 1 tablet (4 mg total) by mouth daily before breakfast. -     metFORMIN (GLUCOPHAGE) 1000 MG tablet; Take 1 tablet (1,000 mg total) by mouth 2 (two) times daily with a meal.  Combined arterial insufficiency and corporo-venous occlusive erectile dysfunction -     sildenafil (REVATIO) 20 MG tablet; Take 2-5 tabs PRN prior to sexual activity   I am having Steven Tanner maintain his aspirin EC, atorvastatin, glimepiride, metFORMIN, and sildenafil.  Meds ordered this encounter  Medications  . atorvastatin (LIPITOR) 40 MG tablet    Sig: Take 1 tablet (40 mg total) by mouth daily.    Dispense:  90 tablet    Refill:  3  . glimepiride (AMARYL) 4 MG tablet    Sig: Take 1 tablet (4 mg total) by mouth daily before breakfast.    Dispense:  30 tablet    Refill:  2  . metFORMIN (GLUCOPHAGE) 1000 MG tablet    Sig: Take 1 tablet (1,000 mg total) by mouth 2 (two) times daily with a meal.    Dispense:  180 tablet    Refill:  3  . sildenafil (REVATIO) 20 MG tablet    Sig: Take 2-5 tabs PRN prior to sexual activity    Dispense:  50 tablet    Refill:  5     Follow-up: Return in about 3 months (around 06/22/2019).  Claretta Fraise, M.D.

## 2019-06-22 ENCOUNTER — Ambulatory Visit: Payer: No Typology Code available for payment source | Admitting: Family Medicine

## 2019-06-30 ENCOUNTER — Ambulatory Visit: Payer: No Typology Code available for payment source | Admitting: Family Medicine

## 2019-07-01 ENCOUNTER — Ambulatory Visit (INDEPENDENT_AMBULATORY_CARE_PROVIDER_SITE_OTHER): Payer: No Typology Code available for payment source | Admitting: Family Medicine

## 2019-07-01 ENCOUNTER — Encounter: Payer: Self-pay | Admitting: Family Medicine

## 2019-07-01 ENCOUNTER — Other Ambulatory Visit: Payer: Self-pay

## 2019-07-01 VITALS — BP 125/81 | HR 111 | Temp 97.5°F | Ht 70.0 in | Wt 267.1 lb

## 2019-07-01 DIAGNOSIS — N5203 Combined arterial insufficiency and corporo-venous occlusive erectile dysfunction: Secondary | ICD-10-CM

## 2019-07-01 DIAGNOSIS — Z114 Encounter for screening for human immunodeficiency virus [HIV]: Secondary | ICD-10-CM

## 2019-07-01 DIAGNOSIS — E782 Mixed hyperlipidemia: Secondary | ICD-10-CM

## 2019-07-01 DIAGNOSIS — E119 Type 2 diabetes mellitus without complications: Secondary | ICD-10-CM

## 2019-07-01 LAB — BAYER DCA HB A1C WAIVED: HB A1C (BAYER DCA - WAIVED): 7.6 % — ABNORMAL HIGH (ref <7.0)

## 2019-07-01 NOTE — Progress Notes (Signed)
Subjective:  Patient ID: Steven Tanner,  male    DOB: 05/20/1970  Age: 49 y.o.    CC: Medical Management of Chronic Issues   HPI GARLON TUGGLE presents for  follow-up of hypertension. Patient has no history of headache chest pain or shortness of breath or recent cough. Patient also denies symptoms of TIA such as numbness weakness lateralizing. Patient denies side effects from medication. States taking it regularly.  Patient also  in for follow-up of elevated cholesterol. Doing well without complaints on current medication. Denies side effects  including myalgia and arthralgia and nausea. Also in today for liver function testing. Currently no chest pain, shortness of breath or other cardiovascular related symptoms noted.  Follow-up of diabetes. Patient does check blood sugar at home. Readings run between 150 and 200 Patient denies symptoms such as excessive hunger or urinary frequency, excessive hunger, nausea No significant hypoglycemic spells noted. Medications reviewed. Pt reports taking them regularly. Pt. denies complication/adverse reaction today.    History Corydon has a past medical history of Abscess, Depression, and GERD (gastroesophageal reflux disease).   He has a past surgical history that includes Incise and drain abcess and Pilonidal cyst excision (N/A, 11/02/2013).   His family history includes Diabetes in his father; Heart disease in his father; Hypertension in his father and mother.He reports that he quit smoking about 1 years ago. His smoking use included cigarettes and cigars. He started smoking about 24 years ago. He has a 20.00 pack-year smoking history. He has never used smokeless tobacco. He reports current alcohol use. He reports current drug use. Drug: Marijuana.  Current Outpatient Medications on File Prior to Visit  Medication Sig Dispense Refill  . aspirin EC 81 MG tablet Take 1 tablet (81 mg total) by mouth daily. 90 tablet 3  . atorvastatin (LIPITOR) 40  MG tablet Take 1 tablet (40 mg total) by mouth daily. 90 tablet 3  . metFORMIN (GLUCOPHAGE) 1000 MG tablet Take 1 tablet (1,000 mg total) by mouth 2 (two) times daily with a meal. 180 tablet 3  . sildenafil (REVATIO) 20 MG tablet Take 2-5 tabs PRN prior to sexual activity 50 tablet 5   No current facility-administered medications on file prior to visit.    ROS Review of Systems  Constitutional: Negative.   HENT: Negative.   Eyes: Negative for visual disturbance.  Respiratory: Negative for cough and shortness of breath.   Cardiovascular: Negative for chest pain and leg swelling.  Gastrointestinal: Negative for abdominal pain, diarrhea, nausea and vomiting.  Genitourinary: Negative for difficulty urinating.  Musculoskeletal: Negative for arthralgias and myalgias.  Skin: Negative for rash.  Neurological: Negative for headaches.  Psychiatric/Behavioral: Negative for sleep disturbance.    Objective:  BP 125/81   Pulse (!) 111   Temp (!) 97.5 F (36.4 C) (Temporal)   Ht '5\' 10"'  (1.778 m)   Wt 267 lb 2 oz (121.2 kg)   BMI 38.33 kg/m   BP Readings from Last 3 Encounters:  07/01/19 125/81  03/24/19 105/67  12/18/18 112/69    Wt Readings from Last 3 Encounters:  07/01/19 267 lb 2 oz (121.2 kg)  03/24/19 272 lb 3.2 oz (123.5 kg)  12/18/18 272 lb (123.4 kg)     Physical Exam Constitutional:      General: He is not in acute distress.    Appearance: He is well-developed.  HENT:     Head: Normocephalic and atraumatic.     Right Ear: External ear normal.  Left Ear: External ear normal.     Nose: Nose normal.  Eyes:     Conjunctiva/sclera: Conjunctivae normal.     Pupils: Pupils are equal, round, and reactive to light.  Cardiovascular:     Rate and Rhythm: Normal rate and regular rhythm.     Heart sounds: Normal heart sounds. No murmur.  Pulmonary:     Effort: Pulmonary effort is normal. No respiratory distress.     Breath sounds: Normal breath sounds. No wheezing or  rales.  Abdominal:     Palpations: Abdomen is soft.     Tenderness: There is no abdominal tenderness.  Musculoskeletal:        General: Normal range of motion.     Cervical back: Normal range of motion and neck supple.  Skin:    General: Skin is warm and dry.  Neurological:     Mental Status: He is alert and oriented to person, place, and time.     Deep Tendon Reflexes: Reflexes are normal and symmetric.  Psychiatric:        Behavior: Behavior normal.        Thought Content: Thought content normal.        Judgment: Judgment normal.     Diabetic Foot Exam - Simple   No data filed        Assessment & Plan:   Parv was seen today for medical management of chronic issues.  Diagnoses and all orders for this visit:  Diabetes mellitus without complication ([place]) -     Microalbumin / creatinine urine ratio -     Bayer DCA Hb A1c Waived -     Cancel: CBC with Differential/Platelet -     Cancel: CMP14+EGFR -     Cancel: Lipid panel -     CBC with Differential/Platelet -     CMP14+EGFR -     Lipid panel -     glimepiride (AMARYL) 4 MG tablet; Take 1 tablet (4 mg total) by mouth daily before breakfast.  Encounter for screening for HIV -     HIV Antibody (routine testing w rflx) -     Cancel: CBC with Differential/Platelet -     Cancel: CMP14+EGFR -     Cancel: Lipid panel  Mixed hyperlipidemia -     Cancel: CBC with Differential/Platelet -     Cancel: CMP14+EGFR -     Cancel: Lipid panel -     CBC with Differential/Platelet -     CMP14+EGFR -     Lipid panel  Combined arterial insufficiency and corporo-venous occlusive erectile dysfunction -     Cancel: CBC with Differential/Platelet -     Cancel: CMP14+EGFR -     Cancel: Lipid panel -     CBC with Differential/Platelet -     CMP14+EGFR -     Lipid panel   I am having Al Decant maintain his aspirin EC, atorvastatin, metFORMIN, sildenafil, and glimepiride.  Meds ordered this encounter  Medications  .  glimepiride (AMARYL) 4 MG tablet    Sig: Take 1 tablet (4 mg total) by mouth daily before breakfast.    Dispense:  30 tablet    Refill:  2     Follow-up: No follow-ups on file.  Claretta Fraise, M.D.

## 2019-07-02 LAB — CMP14+EGFR
ALT: 39 IU/L (ref 0–44)
AST: 44 IU/L — ABNORMAL HIGH (ref 0–40)
Albumin/Globulin Ratio: 1.9 (ref 1.2–2.2)
Albumin: 4.6 g/dL (ref 4.0–5.0)
Alkaline Phosphatase: 74 IU/L (ref 39–117)
BUN/Creatinine Ratio: 13 (ref 9–20)
BUN: 16 mg/dL (ref 6–24)
Bilirubin Total: 0.6 mg/dL (ref 0.0–1.2)
CO2: 19 mmol/L — ABNORMAL LOW (ref 20–29)
Calcium: 9.3 mg/dL (ref 8.7–10.2)
Chloride: 101 mmol/L (ref 96–106)
Creatinine, Ser: 1.24 mg/dL (ref 0.76–1.27)
GFR calc Af Amer: 80 mL/min/{1.73_m2} (ref >59)
GFR calc non Af Amer: 69 mL/min/{1.73_m2} (ref >59)
Globulin, Total: 2.4 g/dL (ref 1.5–4.5)
Glucose: 215 mg/dL — ABNORMAL HIGH (ref 65–99)
Potassium: 4.3 mmol/L (ref 3.5–5.2)
Sodium: 138 mmol/L (ref 134–144)
Total Protein: 7 g/dL (ref 6.0–8.5)

## 2019-07-02 LAB — LIPID PANEL
Chol/HDL Ratio: 5.2 ratio — ABNORMAL HIGH (ref 0.0–5.0)
Cholesterol, Total: 119 mg/dL (ref 100–199)
HDL: 23 mg/dL — ABNORMAL LOW (ref >39)
LDL Chol Calc (NIH): 76 mg/dL (ref 0–99)
Triglycerides: 109 mg/dL (ref 0–149)
VLDL Cholesterol Cal: 20 mg/dL (ref 5–40)

## 2019-07-02 LAB — CBC WITH DIFFERENTIAL/PLATELET
Basophils Absolute: 0 10*3/uL (ref 0.0–0.2)
Basos: 0 %
EOS (ABSOLUTE): 0.2 10*3/uL (ref 0.0–0.4)
Eos: 2 %
Hematocrit: 40.8 % (ref 37.5–51.0)
Hemoglobin: 13.4 g/dL (ref 13.0–17.7)
Immature Grans (Abs): 0.1 10*3/uL (ref 0.0–0.1)
Immature Granulocytes: 1 %
Lymphocytes Absolute: 2.6 10*3/uL (ref 0.7–3.1)
Lymphs: 27 %
MCH: 27.6 pg (ref 26.6–33.0)
MCHC: 32.8 g/dL (ref 31.5–35.7)
MCV: 84 fL (ref 79–97)
Monocytes Absolute: 1 10*3/uL — ABNORMAL HIGH (ref 0.1–0.9)
Monocytes: 10 %
Neutrophils Absolute: 5.8 10*3/uL (ref 1.4–7.0)
Neutrophils: 60 %
Platelets: 276 10*3/uL (ref 150–450)
RBC: 4.86 x10E6/uL (ref 4.14–5.80)
RDW: 14.2 % (ref 11.6–15.4)
WBC: 9.6 10*3/uL (ref 3.4–10.8)

## 2019-07-02 LAB — MICROALBUMIN / CREATININE URINE RATIO
Creatinine, Urine: 749.9 mg/dL
Microalb/Creat Ratio: 20 mg/g creat (ref 0–29)
Microalbumin, Urine: 150.4 ug/mL

## 2019-07-02 LAB — HIV ANTIBODY (ROUTINE TESTING W REFLEX): HIV Screen 4th Generation wRfx: NONREACTIVE

## 2019-07-02 NOTE — Progress Notes (Signed)
Hello [place],  Your lab result is normal and/or stable.Some minor variations that are not significant are commonly marked abnormal, but do not represent any medical problem for you.  Best regards, Renu Asby, M.D.

## 2019-07-04 ENCOUNTER — Encounter: Payer: Self-pay | Admitting: Family Medicine

## 2019-07-04 MED ORDER — GLIMEPIRIDE 4 MG PO TABS: 4.0000 mg | ORAL_TABLET | Freq: Every day | ORAL | 2 refills | Status: DC

## 2019-09-07 IMAGING — NM NM MYOCAR MULTI W/SPECT W/WALL MOTION & EF
2 series · 12 of 12 positions shown · non-contrast
Comparison: none

[Series 2: stress gated - perfusion · 6.51mm/px · 6 of 64 frames shown]
[frame 6/64]
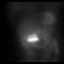
[frame 16/64]
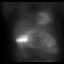
[frame 27/64]
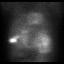
[frame 38/64]
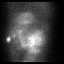
[frame 48/64]
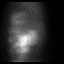
[frame 59/64]
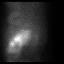

[Series 3: rest · 6.51mm/px · 6 of 64 frames shown]
[frame 6/64]
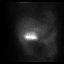
[frame 16/64]
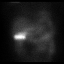
[frame 27/64]
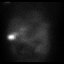
[frame 38/64]
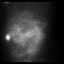
[frame 48/64]
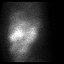
[frame 59/64]
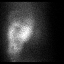

[12 of 12 positions shown; findings below may reference images not displayed]

Canned report from images found in remote index.

Refer to host system for actual result text.

## 2019-10-05 ENCOUNTER — Ambulatory Visit: Payer: No Typology Code available for payment source | Admitting: Family Medicine

## 2019-11-02 ENCOUNTER — Ambulatory Visit: Payer: No Typology Code available for payment source | Admitting: Family Medicine

## 2019-11-04 ENCOUNTER — Encounter: Payer: Self-pay | Admitting: Family Medicine

## 2019-12-03 ENCOUNTER — Other Ambulatory Visit: Payer: Self-pay | Admitting: Family Medicine

## 2019-12-03 DIAGNOSIS — E119 Type 2 diabetes mellitus without complications: Secondary | ICD-10-CM

## 2020-01-04 ENCOUNTER — Other Ambulatory Visit: Payer: Self-pay | Admitting: Family Medicine

## 2020-01-04 DIAGNOSIS — E119 Type 2 diabetes mellitus without complications: Secondary | ICD-10-CM

## 2020-01-05 ENCOUNTER — Telehealth: Payer: Self-pay

## 2020-01-05 DIAGNOSIS — E119 Type 2 diabetes mellitus without complications: Secondary | ICD-10-CM

## 2020-01-05 DIAGNOSIS — E782 Mixed hyperlipidemia: Secondary | ICD-10-CM

## 2020-01-05 MED ORDER — GLIMEPIRIDE 4 MG PO TABS: 4.0000 mg | ORAL_TABLET | Freq: Every day | ORAL | 0 refills | Status: DC

## 2020-01-05 MED ORDER — ATORVASTATIN CALCIUM 40 MG PO TABS: 40.0000 mg | ORAL_TABLET | Freq: Every day | ORAL | 0 refills | Status: DC

## 2020-01-05 NOTE — Telephone Encounter (Signed)
Pt called has appt 11/17 with Dr.[name] but he is out of Rx Glimepride 4mg  and Atorvastatin 40 mg. Please call one refill to [facility] until his appt on the 17th please.

## 2020-01-05 NOTE — Telephone Encounter (Signed)
Pt aware 30d supply sent to [facility]

## 2020-01-30 ENCOUNTER — Other Ambulatory Visit: Payer: Self-pay | Admitting: Family Medicine

## 2020-01-30 DIAGNOSIS — E119 Type 2 diabetes mellitus without complications: Secondary | ICD-10-CM

## 2020-02-03 ENCOUNTER — Encounter: Payer: Self-pay | Admitting: Family Medicine

## 2020-02-03 ENCOUNTER — Other Ambulatory Visit: Payer: Self-pay

## 2020-02-03 ENCOUNTER — Ambulatory Visit (INDEPENDENT_AMBULATORY_CARE_PROVIDER_SITE_OTHER): Payer: Self-pay | Admitting: Family Medicine

## 2020-02-03 VITALS — BP 131/84 | HR 83 | Temp 96.7°F | Ht 70.0 in | Wt 268.0 lb

## 2020-02-03 DIAGNOSIS — N5203 Combined arterial insufficiency and corporo-venous occlusive erectile dysfunction: Secondary | ICD-10-CM

## 2020-02-03 DIAGNOSIS — E119 Type 2 diabetes mellitus without complications: Secondary | ICD-10-CM

## 2020-02-03 DIAGNOSIS — E782 Mixed hyperlipidemia: Secondary | ICD-10-CM

## 2020-02-03 LAB — BAYER DCA HB A1C WAIVED: HB A1C (BAYER DCA - WAIVED): 9.6 % — ABNORMAL HIGH (ref <7.0)

## 2020-02-03 MED ORDER — GLIMEPIRIDE 4 MG PO TABS: 4.0000 mg | ORAL_TABLET | Freq: Every day | ORAL | 1 refills | Status: DC

## 2020-02-03 MED ORDER — SILDENAFIL CITRATE 20 MG PO TABS: ORAL_TABLET | ORAL | 5 refills | Status: DC

## 2020-02-03 MED ORDER — METFORMIN HCL 1000 MG PO TABS: 1000.0000 mg | ORAL_TABLET | Freq: Two times a day (BID) | ORAL | 3 refills | Status: DC

## 2020-02-03 MED ORDER — PIOGLITAZONE HCL 30 MG PO TABS
30.0000 mg | ORAL_TABLET | Freq: Every day | ORAL | 1 refills | Status: DC
Start: 2020-02-03 — End: 2020-05-24

## 2020-02-03 MED ORDER — LISINOPRIL 10 MG PO TABS: 10.0000 mg | ORAL_TABLET | Freq: Every day | ORAL | 3 refills | Status: DC

## 2020-02-03 MED ORDER — ATORVASTATIN CALCIUM 40 MG PO TABS: 40.0000 mg | ORAL_TABLET | Freq: Every day | ORAL | 1 refills | Status: DC

## 2020-02-03 NOTE — Progress Notes (Signed)
Subjective:  Patient ID: Steven Tanner, male    DOB: 1970-04-29  Age: 49 y.o. MRN: [id number]  CC: Follow-up (3 month)   HPI OZIAS DICENZO presents forFollow-up of diabetes. Patient checks blood sugar at home.  No readings returned.  Patient denies symptoms such as polyuria, polydipsia, excessive hunger, nausea No significant hypoglycemic spells noted. Medications reviewed. Pt reports taking them regularly without complication/adverse reaction being reported today.    History Valdemar has a past medical history of Abscess, Depression, and GERD (gastroesophageal reflux disease).   He has a past surgical history that includes Incise and drain abcess and Pilonidal cyst excision (N/A, 11/02/2013).   His family history includes Diabetes in his father; Heart disease in his father; Hypertension in his father and mother.He reports that he quit smoking about 2 years ago. His smoking use included cigarettes and cigars. He started smoking about 24 years ago. He has a 20.00 pack-year smoking history. He has never used smokeless tobacco. He reports current alcohol use. He reports current drug use. Drug: Marijuana.  Current Outpatient Medications on File Prior to Visit  Medication Sig Dispense Refill  . aspirin EC 81 MG tablet Take 1 tablet (81 mg total) by mouth daily. (Patient not taking: Reported on 02/03/2020) 90 tablet 3   No current facility-administered medications on file prior to visit.    ROS Review of Systems  Constitutional: Negative for fever.  Respiratory: Negative for shortness of breath.   Cardiovascular: Negative for chest pain.  Musculoskeletal: Negative for arthralgias.  Skin: Negative for rash.    Objective:  BP 131/84   Pulse 83   Temp (!) 96.7 F (35.9 C) (Temporal)   Ht $R'5\' 10"'zv$  (1.778 m)   Wt 268 lb (121.6 kg)   BMI 38.45 kg/m   BP Readings from Last 3 Encounters:  02/03/20 131/84  07/01/19 125/81  03/24/19 105/67    Wt Readings from Last 3 Encounters:    02/03/20 268 lb (121.6 kg)  07/01/19 267 lb 2 oz (121.2 kg)  03/24/19 272 lb 3.2 oz (123.5 kg)     Physical Exam Vitals reviewed.  Constitutional:      Appearance: He is well-developed.  HENT:     Head: Normocephalic and atraumatic.     Right Ear: External ear normal.     Left Ear: External ear normal.     Mouth/Throat:     Pharynx: No oropharyngeal exudate or posterior oropharyngeal erythema.  Eyes:     Pupils: Pupils are equal, round, and reactive to light.  Cardiovascular:     Rate and Rhythm: Normal rate and regular rhythm.     Heart sounds: No murmur heard.   Pulmonary:     Effort: No respiratory distress.     Breath sounds: Normal breath sounds.  Musculoskeletal:     Cervical back: Normal range of motion and neck supple.  Neurological:     Mental Status: He is alert and oriented to person, place, and time.       Assessment & Plan:   Ruel was seen today for follow-up.  Diagnoses and all orders for this visit:  Diabetes mellitus without complication ([place]) -     CBC with Differential/Platelet -     CMP14+EGFR -     Lipid panel -     Bayer DCA Hb A1c Waived -     glimepiride (AMARYL) 4 MG tablet; Take 1 tablet (4 mg total) by mouth daily with breakfast. -     metFORMIN (  GLUCOPHAGE) 1000 MG tablet; Take 1 tablet (1,000 mg total) by mouth 2 (two) times daily with a meal.  Mixed hyperlipidemia -     CBC with Differential/Platelet -     CMP14+EGFR -     Lipid panel -     Bayer DCA Hb A1c Waived -     atorvastatin (LIPITOR) 40 MG tablet; Take 1 tablet (40 mg total) by mouth daily.  Combined arterial insufficiency and corporo-venous occlusive erectile dysfunction -     sildenafil (REVATIO) 20 MG tablet; Take 2-5 tabs PRN prior to sexual activity  Other orders -     pioglitazone (ACTOS) 30 MG tablet; Take 1 tablet (30 mg total) by mouth daily. To help with diabetes -     lisinopril (ZESTRIL) 10 MG tablet; Take 1 tablet (10 mg total) by mouth daily. To protect  kidneys from diabetic damage      I have changed [place] glimepiride. I am also having him start on pioglitazone and lisinopril. Additionally, I am having him maintain his aspirin EC, atorvastatin, metFORMIN, and sildenafil.  Meds ordered this encounter  Medications  . pioglitazone (ACTOS) 30 MG tablet    Sig: Take 1 tablet (30 mg total) by mouth daily. To help with diabetes    Dispense:  90 tablet    Refill:  1  . lisinopril (ZESTRIL) 10 MG tablet    Sig: Take 1 tablet (10 mg total) by mouth daily. To protect kidneys from diabetic damage    Dispense:  90 tablet    Refill:  3  . atorvastatin (LIPITOR) 40 MG tablet    Sig: Take 1 tablet (40 mg total) by mouth daily.    Dispense:  90 tablet    Refill:  1  . glimepiride (AMARYL) 4 MG tablet    Sig: Take 1 tablet (4 mg total) by mouth daily with breakfast.    Dispense:  90 tablet    Refill:  1  . metFORMIN (GLUCOPHAGE) 1000 MG tablet    Sig: Take 1 tablet (1,000 mg total) by mouth 2 (two) times daily with a meal.    Dispense:  180 tablet    Refill:  3  . sildenafil (REVATIO) 20 MG tablet    Sig: Take 2-5 tabs PRN prior to sexual activity    Dispense:  50 tablet    Refill:  5     Follow-up: Return in about 3 months (around 05/05/2020).  Claretta Fraise, M.D.

## 2020-02-04 LAB — CMP14+EGFR
ALT: 36 IU/L (ref 0–44)
AST: 18 IU/L (ref 0–40)
Albumin/Globulin Ratio: 1.7 (ref 1.2–2.2)
Albumin: 4.5 g/dL (ref 4.0–5.0)
Alkaline Phosphatase: 90 IU/L (ref 44–121)
BUN/Creatinine Ratio: 11 (ref 9–20)
BUN: 14 mg/dL (ref 6–24)
Bilirubin Total: 0.5 mg/dL (ref 0.0–1.2)
CO2: 22 mmol/L (ref 20–29)
Calcium: 9.8 mg/dL (ref 8.7–10.2)
Chloride: 102 mmol/L (ref 96–106)
Creatinine, Ser: 1.26 mg/dL (ref 0.76–1.27)
GFR calc Af Amer: 78 mL/min/{1.73_m2} (ref >59)
GFR calc non Af Amer: 67 mL/min/{1.73_m2} (ref >59)
Globulin, Total: 2.7 g/dL (ref 1.5–4.5)
Glucose: 271 mg/dL — ABNORMAL HIGH (ref 65–99)
Potassium: 4.7 mmol/L (ref 3.5–5.2)
Sodium: 139 mmol/L (ref 134–144)
Total Protein: 7.2 g/dL (ref 6.0–8.5)

## 2020-02-04 LAB — CBC WITH DIFFERENTIAL/PLATELET
Basophils Absolute: 0.1 10*3/uL (ref 0.0–0.2)
Basos: 1 %
EOS (ABSOLUTE): 0.2 10*3/uL (ref 0.0–0.4)
Eos: 3 %
Hematocrit: 43.2 % (ref 37.5–51.0)
Hemoglobin: 14.5 g/dL (ref 13.0–17.7)
Immature Grans (Abs): 0 10*3/uL (ref 0.0–0.1)
Immature Granulocytes: 0 %
Lymphocytes Absolute: 3.1 10*3/uL (ref 0.7–3.1)
Lymphs: 42 %
MCH: 27.5 pg (ref 26.6–33.0)
MCHC: 33.6 g/dL (ref 31.5–35.7)
MCV: 82 fL (ref 79–97)
Monocytes Absolute: 0.7 10*3/uL (ref 0.1–0.9)
Monocytes: 10 %
Neutrophils Absolute: 3.3 10*3/uL (ref 1.4–7.0)
Neutrophils: 44 %
Platelets: 311 10*3/uL (ref 150–450)
RBC: 5.27 x10E6/uL (ref 4.14–5.80)
RDW: 13.5 % (ref 11.6–15.4)
WBC: 7.4 10*3/uL (ref 3.4–10.8)

## 2020-02-04 LAB — LIPID PANEL
Chol/HDL Ratio: 5.3 ratio — ABNORMAL HIGH (ref 0.0–5.0)
Cholesterol, Total: 143 mg/dL (ref 100–199)
HDL: 27 mg/dL — ABNORMAL LOW (ref >39)
LDL Chol Calc (NIH): 79 mg/dL (ref 0–99)
Triglycerides: 219 mg/dL — ABNORMAL HIGH (ref 0–149)
VLDL Cholesterol Cal: 37 mg/dL (ref 5–40)

## 2020-05-05 ENCOUNTER — Ambulatory Visit: Payer: Self-pay | Admitting: Family Medicine

## 2020-05-05 DIAGNOSIS — Z1159 Encounter for screening for other viral diseases: Secondary | ICD-10-CM

## 2020-05-05 DIAGNOSIS — E119 Type 2 diabetes mellitus without complications: Secondary | ICD-10-CM

## 2020-05-05 DIAGNOSIS — E782 Mixed hyperlipidemia: Secondary | ICD-10-CM

## 2020-05-24 ENCOUNTER — Other Ambulatory Visit: Payer: Self-pay

## 2020-05-24 ENCOUNTER — Encounter: Payer: Self-pay | Admitting: Family Medicine

## 2020-05-24 ENCOUNTER — Ambulatory Visit (INDEPENDENT_AMBULATORY_CARE_PROVIDER_SITE_OTHER): Payer: Self-pay | Admitting: Family Medicine

## 2020-05-24 VITALS — BP 119/70 | HR 89 | Temp 97.2°F | Ht 70.0 in | Wt 275.2 lb

## 2020-05-24 DIAGNOSIS — E782 Mixed hyperlipidemia: Secondary | ICD-10-CM

## 2020-05-24 DIAGNOSIS — N5203 Combined arterial insufficiency and corporo-venous occlusive erectile dysfunction: Secondary | ICD-10-CM

## 2020-05-24 DIAGNOSIS — E119 Type 2 diabetes mellitus without complications: Secondary | ICD-10-CM

## 2020-05-24 MED ORDER — PIOGLITAZONE HCL 30 MG PO TABS
30.0000 mg | ORAL_TABLET | Freq: Every day | ORAL | 1 refills | Status: DC
Start: 2020-05-24 — End: 2020-12-27

## 2020-05-24 NOTE — Patient Instructions (Signed)
Calorie Counting for Weight Loss Calories are units of energy. Your body needs a certain number of calories from food to keep going throughout the day. When you eat or drink more calories than your body needs, your body stores the extra calories mostly as fat. When you eat or drink fewer calories than your body needs, your body burns fat to get the energy it needs. Calorie counting means keeping track of how many calories you eat and drink each day. Calorie counting can be helpful if you need to lose weight. If you eat fewer calories than your body needs, you should lose weight. Ask your health care provider what a healthy weight is for you. For calorie counting to work, you will need to eat the right number of calories each day to lose a healthy amount of weight per week. A dietitian can help you figure out how many calories you need in a day and will suggest ways to reach your calorie goal.  A healthy amount of weight to lose each week is usually 1-2 lb (0.5-0.9 kg). This usually means that your daily calorie intake should be reduced by 500-750 calories.  Eating 1,200-1,500 calories a day can help most women lose weight.  Eating 1,500-1,800 calories a day can help most men lose weight. What do I need to know about calorie counting? Work with your health care provider or dietitian to determine how many calories you should get each day. To meet your daily calorie goal, you will need to:  Find out how many calories are in each food that you would like to eat. Try to do this before you eat.  Decide how much of the food you plan to eat.  Keep a food log. Do this by writing down what you ate and how many calories it had. To successfully lose weight, it is important to balance calorie counting with a healthy lifestyle that includes regular activity. Where do I find calorie information? The number of calories in a food can be found on a Nutrition Facts label. If a food does not have a Nutrition Facts  label, try to look up the calories online or ask your dietitian for help. Remember that calories are listed per serving. If you choose to have more than one serving of a food, you will have to multiply the calories per serving by the number of servings you plan to eat. For example, the label on a package of bread might say that a serving size is 1 slice and that there are 90 calories in a serving. If you eat 1 slice, you will have eaten 90 calories. If you eat 2 slices, you will have eaten 180 calories.   How do I keep a food log? After each time that you eat, record the following in your food log as soon as possible:  What you ate. Be sure to include toppings, sauces, and other extras on the food.  How much you ate. This can be measured in cups, ounces, or number of items.  How many calories were in each food and drink.  The total number of calories in the food you ate. Keep your food log near you, such as in a pocket-sized notebook or on an app or website on your mobile phone. Some programs will calculate calories for you and show you how many calories you have left to meet your daily goal. What are some portion-control tips?  Know how many calories are in a serving. This will   help you know how many servings you can have of a certain food.  Use a measuring cup to measure serving sizes. You could also try weighing out portions on a kitchen scale. With time, you will be able to estimate serving sizes for some foods.  Take time to put servings of different foods on your favorite plates or in your favorite bowls and cups so you know what a serving looks like.  Try not to eat straight from a food's packaging, such as from a bag or box. Eating straight from the package makes it hard to see how much you are eating and can lead to overeating. Put the amount you would like to eat in a cup or on a plate to make sure you are eating the right portion.  Use smaller plates, glasses, and bowls for smaller  portions and to prevent overeating.  Try not to multitask. For example, avoid watching TV or using your computer while eating. If it is time to eat, sit down at a table and enjoy your food. This will help you recognize when you are full. It will also help you be more mindful of what and how much you are eating. What are tips for following this plan? Reading food labels  Check the calorie count compared with the serving size. The serving size may be smaller than what you are used to eating.  Check the source of the calories. Try to choose foods that are high in protein, fiber, and vitamins, and low in saturated fat, trans fat, and sodium. Shopping  Read nutrition labels while you shop. This will help you make healthy decisions about which foods to buy.  Pay attention to nutrition labels for low-fat or fat-free foods. These foods sometimes have the same number of calories or more calories than the full-fat versions. They also often have added sugar, starch, or salt to make up for flavor that was removed with the fat.  Make a grocery list of lower-calorie foods and stick to it. Cooking  Try to cook your favorite foods in a healthier way. For example, try baking instead of frying.  Use low-fat dairy products. Meal planning  Use more fruits and vegetables. One-half of your plate should be fruits and vegetables.  Include lean proteins, such as chicken, turkey, and fish. Lifestyle Each week, aim to do one of the following:  150 minutes of moderate exercise, such as walking.  75 minutes of vigorous exercise, such as running. General information  Know how many calories are in the foods you eat most often. This will help you calculate calorie counts faster.  Find a way of tracking calories that works for you. Get creative. Try different apps or programs if writing down calories does not work for you. What foods should I eat?  Eat nutritious foods. It is better to have a nutritious,  high-calorie food, such as an avocado, than a food with few nutrients, such as a bag of potato chips.  Use your calories on foods and drinks that will fill you up and will not leave you hungry soon after eating. ? Examples of foods that fill you up are nuts and nut butters, vegetables, lean proteins, and high-fiber foods such as whole grains. High-fiber foods are foods with more than 5 g of fiber per serving.  Pay attention to calories in drinks. Low-calorie drinks include water and unsweetened drinks. The items listed above may not be a complete list of foods and beverages you can eat.   Contact a dietitian for more information.   What foods should I limit? Limit foods or drinks that are not good sources of vitamins, minerals, or protein or that are high in unhealthy fats. These include:  Candy.  Other sweets.  Sodas, specialty coffee drinks, alcohol, and juice. The items listed above may not be a complete list of foods and beverages you should avoid. Contact a dietitian for more information. How do I count calories when eating out?  Pay attention to portions. Often, portions are much larger when eating out. Try these tips to keep portions smaller: ? Consider sharing a meal instead of getting your own. ? If you get your own meal, eat only half of it. Before you start eating, ask for a container and put half of your meal into it. ? When available, consider ordering smaller portions from the menu instead of full portions.  Pay attention to your food and drink choices. Knowing the way food is cooked and what is included with the meal can help you eat fewer calories. ? If calories are listed on the menu, choose the lower-calorie options. ? Choose dishes that include vegetables, fruits, whole grains, low-fat dairy products, and lean proteins. ? Choose items that are boiled, broiled, grilled, or steamed. Avoid items that are buttered, battered, fried, or served with cream sauce. Items labeled as  crispy are usually fried, unless stated otherwise. ? Choose water, low-fat milk, unsweetened iced tea, or other drinks without added sugar. If you want an alcoholic beverage, choose a lower-calorie option, such as a glass of wine or light beer. ? Ask for dressings, sauces, and syrups on the side. These are usually high in calories, so you should limit the amount you eat. ? If you want a salad, choose a garden salad and ask for grilled meats. Avoid extra toppings such as bacon, cheese, or fried items. Ask for the dressing on the side, or ask for olive oil and vinegar or lemon to use as dressing.  Estimate how many servings of a food you are given. Knowing serving sizes will help you be aware of how much food you are eating at restaurants. Where to find more information  Centers for Disease Control and Prevention: www.cdc.gov  [place] Department of Agriculture: myplate.gov Summary  Calorie counting means keeping track of how many calories you eat and drink each day. If you eat fewer calories than your body needs, you should lose weight.  A healthy amount of weight to lose per week is usually 1-2 lb (0.5-0.9 kg). This usually means reducing your daily calorie intake by 500-750 calories.  The number of calories in a food can be found on a Nutrition Facts label. If a food does not have a Nutrition Facts label, try to look up the calories online or ask your dietitian for help.  Use smaller plates, glasses, and bowls for smaller portions and to prevent overeating.  Use your calories on foods and drinks that will fill you up and not leave you hungry shortly after a meal. This information is not intended to replace advice given to you by your health care provider. Make sure you discuss any questions you have with your health care provider. Document Revised: 04/16/2019 Document Reviewed: 04/16/2019 Elsevier Patient Education  2021 Elsevier Inc.  

## 2020-05-24 NOTE — Progress Notes (Signed)
Subjective:  Patient ID: Steven Tanner,  male    DOB: [birth year]  Age: 50 y.o.    CC: Medical Management of Chronic Issues   HPI BENCE TRAPP presents for follow-up of elevated cholesterol. Doing well without complaints on current medication. Denies side effects  including myalgia and arthralgia and nausea. Also in today for liver function testing. Currently no chest pain, shortness of breath or other cardiovascular related symptoms noted.  Follow-up of diabetes. Patient does check blood sugar at home. Readings run between 160 and 215 Patient denies symptoms such as excessive hunger or urinary frequency, excessive hunger, nausea No significant hypoglycemic spells noted. Medications reviewed. Pt reports taking them regularly. Pt. denies complication/adverse reaction today.  The exception to this is that he was unaware that he was supposed to be taking pioglitazone.  He has never taken it.  History Taksh has a past medical history of Abscess, Depression, and GERD (gastroesophageal reflux disease).   He has a past surgical history that includes Incise and drain abcess and Pilonidal cyst excision (N/A, 11/02/2013).   His family history includes Diabetes in his father; Heart disease in his father; Hypertension in his father and mother.He reports that he quit smoking about 2 years ago. His smoking use included cigarettes and cigars. He started smoking about 25 years ago. He has a 20.00 pack-year smoking history. He has never used smokeless tobacco. He reports current alcohol use. He reports current drug use. Drug: Marijuana.  Current Outpatient Medications on File Prior to Visit  Medication Sig Dispense Refill  . atorvastatin (LIPITOR) 40 MG tablet Take 1 tablet (40 mg total) by mouth daily. 90 tablet 1  . glimepiride (AMARYL) 4 MG tablet Take 1 tablet (4 mg total) by mouth daily with breakfast. 90 tablet 1  . lisinopril (ZESTRIL) 10 MG tablet Take 1 tablet (10 mg total) by mouth daily.  To protect kidneys from diabetic damage 90 tablet 3  . metFORMIN (GLUCOPHAGE) 1000 MG tablet Take 1 tablet (1,000 mg total) by mouth 2 (two) times daily with a meal. 180 tablet 3  . sildenafil (REVATIO) 20 MG tablet Take 2-5 tabs PRN prior to sexual activity 50 tablet 5  . aspirin EC 81 MG tablet Take 1 tablet (81 mg total) by mouth daily. (Patient not taking: Reported on 05/24/2020) 90 tablet 3   No current facility-administered medications on file prior to visit.    ROS Review of Systems  Constitutional: Negative for fever.  Respiratory: Negative for shortness of breath.   Cardiovascular: Negative for chest pain.  Musculoskeletal: Negative for arthralgias.  Skin: Negative for rash.    Objective:  BP 119/70   Pulse 89   Temp (!) 97.2 F (36.2 C)   Ht 5\' 10"  (1.778 m)   Wt 275 lb 3.2 oz (124.8 kg)   SpO2 98%   BMI 39.49 kg/m   BP Readings from Last 3 Encounters:  05/24/20 119/70  02/03/20 131/84  07/01/19 125/81    Wt Readings from Last 3 Encounters:  05/24/20 275 lb 3.2 oz (124.8 kg)  02/03/20 268 lb (121.6 kg)  07/01/19 267 lb 2 oz (121.2 kg)     Physical Exam Vitals reviewed.  Constitutional:      Appearance: He is well-developed and well-nourished.  HENT:     Head: Normocephalic and atraumatic.     Right Ear: Tympanic membrane and external ear normal. No decreased hearing noted.     Left Ear: Tympanic membrane and external ear normal. No  decreased hearing noted.     Mouth/Throat:     Pharynx: No oropharyngeal exudate or posterior oropharyngeal erythema.  Eyes:     Pupils: Pupils are equal, round, and reactive to light.  Cardiovascular:     Rate and Rhythm: Normal rate and regular rhythm.     Heart sounds: No murmur heard.   Pulmonary:     Effort: No respiratory distress.     Breath sounds: Normal breath sounds.  Abdominal:     General: Bowel sounds are normal.     Palpations: Abdomen is soft. There is no mass.     Tenderness: There is no abdominal  tenderness.  Musculoskeletal:     Cervical back: Normal range of motion and neck supple.     Diabetic Foot Exam - Simple   Simple Foot Form Diabetic Foot exam was performed with the following findings: Yes 05/24/2020  8:11 AM  Visual Inspection No deformities, no ulcerations, no other skin breakdown bilaterally: Yes Sensation Testing Intact to touch and monofilament testing bilaterally: Yes Pulse Check Posterior Tibialis and Dorsalis pulse intact bilaterally: Yes Comments       Assessment & Plan:   Tyshun was seen today for medical management of chronic issues.  Diagnoses and all orders for this visit:  Diabetes mellitus without complication ([place])  Mixed hyperlipidemia  Combined arterial insufficiency and corporo-venous occlusive erectile dysfunction  Other orders -     pioglitazone (ACTOS) 30 MG tablet; Take 1 tablet (30 mg total) by mouth daily. To help with diabetes   I am having Al Decant maintain his aspirin EC, lisinopril, atorvastatin, glimepiride, metFORMIN, sildenafil, and pioglitazone.  Meds ordered this encounter  Medications  . pioglitazone (ACTOS) 30 MG tablet    Sig: Take 1 tablet (30 mg total) by mouth daily. To help with diabetes    Dispense:  90 tablet    Refill:  1     Follow-up: Return in about 3 months (around 08/24/2020).  Claretta Fraise, M.D.

## 2020-07-30 ENCOUNTER — Encounter (HOSPITAL_COMMUNITY): Payer: Self-pay | Admitting: *Deleted

## 2020-07-30 ENCOUNTER — Emergency Department (HOSPITAL_COMMUNITY)
Admission: EM | Admit: 2020-07-30 | Discharge: 2020-07-30 | Disposition: A | Discharge status: Home / Self Care | Payer: Self-pay | Attending: Emergency Medicine | Admitting: Emergency Medicine

## 2020-07-30 ENCOUNTER — Emergency Department (HOSPITAL_COMMUNITY): Payer: Self-pay

## 2020-07-30 ENCOUNTER — Other Ambulatory Visit: Payer: Self-pay

## 2020-07-30 DIAGNOSIS — R000 Tachycardia, unspecified: Secondary | ICD-10-CM | POA: Insufficient documentation

## 2020-07-30 DIAGNOSIS — Z20822 Contact with and (suspected) exposure to covid-19: Secondary | ICD-10-CM | POA: Insufficient documentation

## 2020-07-30 DIAGNOSIS — R0789 Other chest pain: Secondary | ICD-10-CM | POA: Insufficient documentation

## 2020-07-30 DIAGNOSIS — R059 Cough, unspecified: Secondary | ICD-10-CM | POA: Insufficient documentation

## 2020-07-30 DIAGNOSIS — E119 Type 2 diabetes mellitus without complications: Secondary | ICD-10-CM | POA: Insufficient documentation

## 2020-07-30 DIAGNOSIS — Z7984 Long term (current) use of oral hypoglycemic drugs: Secondary | ICD-10-CM | POA: Insufficient documentation

## 2020-07-30 DIAGNOSIS — Z7982 Long term (current) use of aspirin: Secondary | ICD-10-CM | POA: Insufficient documentation

## 2020-07-30 DIAGNOSIS — Z87891 Personal history of nicotine dependence: Secondary | ICD-10-CM | POA: Insufficient documentation

## 2020-07-30 DIAGNOSIS — J3489 Other specified disorders of nose and nasal sinuses: Secondary | ICD-10-CM | POA: Insufficient documentation

## 2020-07-30 LAB — BASIC METABOLIC PANEL
Anion gap: 8 (ref 5–15)
BUN: 13 mg/dL (ref 6–20)
CO2: 23 mmol/L (ref 22–32)
Calcium: 8.9 mg/dL (ref 8.9–10.3)
Chloride: 104 mmol/L (ref 98–111)
Creatinine, Ser: 1.19 mg/dL (ref 0.61–1.24)
GFR, Estimated: >60 mL/min (ref >60)
Glucose, Bld: 227 mg/dL — ABNORMAL HIGH (ref 70–99)
Potassium: 4.1 mmol/L (ref 3.5–5.1)
Sodium: 135 mmol/L (ref 135–145)

## 2020-07-30 LAB — D-DIMER, QUANTITATIVE: D-Dimer, Quant: 0.28 ug/mL-FEU (ref 0.00–0.50)

## 2020-07-30 LAB — CBC WITH DIFFERENTIAL/PLATELET
Abs Immature Granulocytes: 0.01 10*3/uL (ref 0.00–0.07)
Basophils Absolute: 0.1 10*3/uL (ref 0.0–0.1)
Basophils Relative: 1 %
Eosinophils Absolute: 0.1 10*3/uL (ref 0.0–0.5)
Eosinophils Relative: 2 %
HCT: 41.7 % (ref 39.0–52.0)
Hemoglobin: 13.3 g/dL (ref 13.0–17.0)
Immature Granulocytes: 0 %
Lymphocytes Relative: 37 %
Lymphs Abs: 1.9 10*3/uL (ref 0.7–4.0)
MCH: 27.3 pg (ref 26.0–34.0)
MCHC: 31.9 g/dL (ref 30.0–36.0)
MCV: 85.6 fL (ref 80.0–100.0)
Monocytes Absolute: 0.9 10*3/uL (ref 0.1–1.0)
Monocytes Relative: 17 %
Neutro Abs: 2.3 10*3/uL (ref 1.7–7.7)
Neutrophils Relative %: 43 %
Platelets: 257 10*3/uL (ref 150–400)
RBC: 4.87 MIL/uL (ref 4.22–5.81)
RDW: 14.3 % (ref 11.5–15.5)
WBC: 5.2 10*3/uL (ref 4.0–10.5)
nRBC: 0 % (ref 0.0–0.2)

## 2020-07-30 LAB — TROPONIN I (HIGH SENSITIVITY): Troponin I (High Sensitivity): 4 ng/L (ref <18)

## 2020-07-30 LAB — RESP PANEL BY RT-PCR (FLU A&B, COVID) ARPGX2
Influenza A by PCR: NEGATIVE
Influenza B by PCR: NEGATIVE
SARS Coronavirus 2 by RT PCR: NEGATIVE

## 2020-07-30 MED ORDER — BENZONATATE 100 MG PO CAPS: 100.0000 mg | ORAL_CAPSULE | Freq: Three times a day (TID) | ORAL | 0 refills | Status: AC

## 2020-07-30 MED ORDER — NAPROXEN 375 MG PO TABS: 375.0000 mg | ORAL_TABLET | Freq: Two times a day (BID) | ORAL | 0 refills | Status: DC

## 2020-07-30 NOTE — ED Notes (Signed)
Pt returned from xray

## 2020-07-30 NOTE — Discharge Instructions (Signed)
Take tessalon as directed for cough  You may alternate taking Tylenol and Naproxen as needed for pain control. You may take Naproxen twice daily as directed on your discharge paperwork and you may take  [phone] mg of Tylenol every 6 hours. Do not exceed 4000 mg of Tylenol daily as this can lead to liver damage. Also, make sure to take Naproxen with meals as it can cause an upset stomach. Do not take other NSAIDs while taking Naproxen such as (Aleve, Ibuprofen, Aspirin, Celebrex, etc) and do not take more than the prescribed dose as this can lead to ulcers and bleeding in your GI tract. You may use warm and cold compresses to help with your symptoms.   Please follow up with your primary doctor within the next 7-10 days for re-evaluation and further treatment of your symptoms.   Please return to the ER sooner if you have any new or worsening symptoms.

## 2020-07-30 NOTE — ED Provider Notes (Signed)
[facility] EMERGENCY DEPARTMENT Provider Note   CSN: [id number] Arrival date & time: 07/30/20  1159     History Chief Complaint  Patient presents with  . Chest Pain    Steven Tanner is a 50 y.o. male.  HPI   Pt is a 50 y/o male with a h/o abscess, depression, GERD who presents to the ED today for eval of chest pain. States pain is located to the right side of his chest and is only present when he coughs or moves a certain way. At present, he has no chest pain. When he has the pain it feels like he has a pulled muscle. Denies that his symptoms are associated with exertion. He did start having a cough 2 days ago. The cough is productive. Denies sob, pleuritic pain. Reports some rhinorrhea and states that he has allergies.  Denies leg pain/swelling, hemoptysis, recent surgery, hormone use, personal hx of cancer, or hx of DVT/PE. Pt is a Administrator.   Past Medical History:  Diagnosis Date  . Abscess   . Depression   . GERD (gastroesophageal reflux disease)     Patient Active Problem List   Diagnosis Date Noted  . Mixed hyperlipidemia 04/01/2019  . Combined arterial insufficiency and corporo-venous occlusive erectile dysfunction 09/16/2018  . Diabetes mellitus without complication ([place]) 61/95/0932    Past Surgical History:  Procedure Laterality Date  . INCISE AND DRAIN ABCESS     pilonodil cyst-[facility]  . PILONIDAL CYST EXCISION N/A 11/02/2013   Procedure: CYST EXCISION PILONIDAL SIMPLE;  Surgeon: Jamesetta So, MD;  Location: [facility];  Service: General;  Laterality: N/A;       Family History  Problem Relation Age of Onset  . Hypertension Mother   . Diabetes Father   . Hypertension Father   . Heart disease Father     Social History   Tobacco Use  . Smoking status: Former Smoker    Packs/day: 1.00    Years: 20.00    Pack years: 20.00    Types: Cigarettes, Cigars    Start date: 02/15/1995    Quit date: 07/17/2017    Years since quitting: 3.0  . Smokeless  tobacco: Never Used  Vaping Use  . Vaping Use: Some days  Substance Use Topics  . Alcohol use: Yes    Comment: rare  . Drug use: Yes    Types: Marijuana    Comment: weekly- last used 10/29/2013    Home Medications Prior to Admission medications   Medication Sig Start Date End Date Taking? Authorizing Provider  benzonatate (TESSALON) 100 MG capsule Take 1 capsule (100 mg total) by mouth every 8 (eight) hours for 5 days. 07/30/20 08/04/20 Yes Mandell Pangborn S, PA-C  naproxen (NAPROSYN) 375 MG tablet Take 1 tablet (375 mg total) by mouth 2 (two) times daily. 07/30/20  Yes Reshaun Briseno S, PA-C  aspirin EC 81 MG tablet Take 1 tablet (81 mg total) by mouth daily. Patient not taking: Reported on 05/24/2020 12/11/17   Arnoldo Lenis, MD  atorvastatin (LIPITOR) 40 MG tablet Take 1 tablet (40 mg total) by mouth daily. 02/03/20   Claretta Fraise, MD  glimepiride (AMARYL) 4 MG tablet Take 1 tablet (4 mg total) by mouth daily with breakfast. 02/03/20   Claretta Fraise, MD  lisinopril (ZESTRIL) 10 MG tablet Take 1 tablet (10 mg total) by mouth daily. To protect kidneys from diabetic damage 02/03/20   Claretta Fraise, MD  metFORMIN (GLUCOPHAGE) 1000 MG tablet Take 1 tablet (  1,000 mg total) by mouth 2 (two) times daily with a meal. 02/03/20   Claretta Fraise, MD  pioglitazone (ACTOS) 30 MG tablet Take 1 tablet (30 mg total) by mouth daily. To help with diabetes 05/24/20   Claretta Fraise, MD  sildenafil (REVATIO) 20 MG tablet Take 2-5 tabs PRN prior to sexual activity 02/03/20   Claretta Fraise, MD    Allergies    Codeine and Sulfa antibiotics  Review of Systems   Review of Systems  Constitutional: Negative for chills and fever.  HENT: Negative for ear pain and sore throat.   Eyes: Negative for pain and visual disturbance.  Respiratory: Positive for cough. Negative for shortness of breath.   Cardiovascular: Positive for chest pain.  Gastrointestinal: Negative for abdominal pain, constipation, diarrhea,  nausea and vomiting.  Genitourinary: Negative for dysuria and hematuria.  Musculoskeletal: Negative for back pain.  Skin: Negative for rash.  Neurological: Negative for headaches.  All other systems reviewed and are negative.   Physical Exam Updated Vital Signs BP 125/73   Pulse 88   Temp 98.4 F (36.9 C) (Oral)   Resp 16   Ht 5\' 10"  (1.778 m)   Wt 120.2 kg   SpO2 98%   BMI 38.02 kg/m   Physical Exam Vitals and nursing note reviewed.  Constitutional:      Appearance: He is well-developed.  HENT:     Head: Normocephalic and atraumatic.  Eyes:     Conjunctiva/sclera: Conjunctivae normal.  Cardiovascular:     Rate and Rhythm: Normal rate and regular rhythm.     Heart sounds: Normal heart sounds. No murmur heard.   Pulmonary:     Effort: Pulmonary effort is normal. No respiratory distress.     Breath sounds: Normal breath sounds. No decreased breath sounds, wheezing, rhonchi or rales.  Chest:     Chest wall: No tenderness.  Abdominal:     Palpations: Abdomen is soft.     Tenderness: There is no abdominal tenderness. There is no guarding or rebound.  Musculoskeletal:     Cervical back: Neck supple.     Right lower leg: No tenderness. No edema.     Left lower leg: No tenderness. No edema.  Skin:    General: Skin is warm and dry.  Neurological:     Mental Status: He is alert.     ED Results / Procedures / Treatments   Labs (all labs ordered are listed, but only abnormal results are displayed) Labs Reviewed  BASIC METABOLIC PANEL - Abnormal; Notable for the following components:      Result Value   Glucose, Bld 227 (*)    All other components within normal limits  RESP PANEL BY RT-PCR (FLU A&B, COVID) ARPGX2  CBC WITH DIFFERENTIAL/PLATELET  D-DIMER, QUANTITATIVE  TROPONIN I (HIGH SENSITIVITY)  TROPONIN I (HIGH SENSITIVITY)    EKG None  Radiology DG Chest 2 View  Result Date: 07/30/2020 CLINICAL DATA:  Pt c/o chest pain and cough x Thursday. Pt denies  any SOB. No previous surgery to chest. No pending Covid test. EXAM: CHEST - 2 VIEW COMPARISON:  none FINDINGS: Lungs are clear. Heart size and mediastinal contours are within normal limits. No effusion.  No pneumothorax. Visualized bones unremarkable. IMPRESSION: No acute cardiopulmonary disease. Electronically Signed   By: Lucrezia Europe M.D.   On: 07/30/2020 13:34    Procedures Procedures   Medications Ordered in ED Medications - No data to display  ED Course  I have reviewed the triage  vital signs and the nursing notes.  Pertinent labs & imaging results that were available during my care of the patient were reviewed by me and considered in my medical decision making (see chart for details).    MDM Rules/Calculators/A&P                          50 year old male presenting emergency department today for evaluation of right-sided chest pain.  Has also had cough recently.  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    CBC, BMP, troponin negative.  D-dimer is also negative therefore PE unlikely.  EKG shows some sinus tachycardia but there are no acute ischemic changes.  Chest x-ray reviewed/interpreted and does not show any acute abnormalities.  Patient symptoms do not sound consistent with ACS and he had a low risk stress test in 2019.  Doubt, dissection, esophageal rupture or other emergent etiology of symptoms at this time.  Suspect that he may have viral URI given his cough.  We will give him cough medicine and anti-inflammatories for his symptoms.  Have advised close follow-up with PCP and strict return precautions.  He voices understanding of the plan and reasons to return.  All questions answered.  Patient stable for discharge.   Final Clinical Impression(s) / ED Diagnoses Final diagnoses:  Atypical chest pain  Cough    Rx / DC Orders ED Discharge Orders         Ordered    benzonatate (TESSALON) 100 MG capsule  Every 8 hours        07/30/20 1445    naproxen (NAPROSYN) 375  MG tablet  2 times daily        07/30/20 [address], [employer], PA-C 07/30/20 1445    Milton Ferguson, MD 07/30/20 1452

## 2020-07-30 NOTE — ED Notes (Signed)
Pt transported to xray 

## 2020-08-25 ENCOUNTER — Ambulatory Visit: Payer: Self-pay | Admitting: Family Medicine

## 2020-09-13 ENCOUNTER — Ambulatory Visit: Payer: Self-pay | Admitting: Family Medicine

## 2020-09-14 ENCOUNTER — Encounter: Payer: Self-pay | Admitting: Family Medicine

## 2020-10-14 ENCOUNTER — Other Ambulatory Visit: Payer: Self-pay | Admitting: Family Medicine

## 2020-10-14 DIAGNOSIS — E119 Type 2 diabetes mellitus without complications: Secondary | ICD-10-CM

## 2020-11-14 ENCOUNTER — Ambulatory Visit: Payer: Self-pay | Admitting: Family Medicine

## 2020-11-18 ENCOUNTER — Other Ambulatory Visit: Payer: Self-pay | Admitting: Family Medicine

## 2020-11-18 DIAGNOSIS — E119 Type 2 diabetes mellitus without complications: Secondary | ICD-10-CM

## 2020-11-24 ENCOUNTER — Ambulatory Visit: Payer: Self-pay | Admitting: Family Medicine

## 2020-11-28 ENCOUNTER — Telehealth: Payer: Self-pay | Admitting: Family Medicine

## 2020-11-28 NOTE — Telephone Encounter (Signed)
  Prescription Request  11/28/2020  Is this a "Controlled Substance" medicine? no Have you seen your PCP in the last 2 weeks? Pt has appt 12/28/2020 If YES, route message to pool  -  If NO, patient needs to be scheduled for appointment.  What is the name of the medication or equipment? Metformin and glipiride  Have you contacted your pharmacy to request a refill?   Which pharmacy would you [facility] sent to? [facility]   Patient notified that their request is being sent to the clinical staff for review and that they should receive a response within 2 business days.

## 2020-12-25 ENCOUNTER — Other Ambulatory Visit: Payer: Self-pay | Admitting: Family Medicine

## 2020-12-25 DIAGNOSIS — E119 Type 2 diabetes mellitus without complications: Secondary | ICD-10-CM

## 2020-12-28 ENCOUNTER — Ambulatory Visit (INDEPENDENT_AMBULATORY_CARE_PROVIDER_SITE_OTHER): Payer: Self-pay | Admitting: Family Medicine

## 2020-12-28 ENCOUNTER — Other Ambulatory Visit: Payer: Self-pay

## 2020-12-28 ENCOUNTER — Encounter: Payer: Self-pay | Admitting: Family Medicine

## 2020-12-28 VITALS — BP 130/82 | HR 92 | Temp 97.7°F | Ht 70.0 in | Wt 265.4 lb

## 2020-12-28 DIAGNOSIS — E119 Type 2 diabetes mellitus without complications: Secondary | ICD-10-CM

## 2020-12-28 DIAGNOSIS — E782 Mixed hyperlipidemia: Secondary | ICD-10-CM

## 2020-12-28 MED ORDER — LISINOPRIL 10 MG PO TABS: 10.0000 mg | ORAL_TABLET | Freq: Every day | ORAL | 3 refills | Status: DC

## 2020-12-28 MED ORDER — METFORMIN HCL 1000 MG PO TABS: 1000.0000 mg | ORAL_TABLET | Freq: Two times a day (BID) | ORAL | 3 refills | Status: DC

## 2020-12-28 MED ORDER — GLIMEPIRIDE 4 MG PO TABS: 4.0000 mg | ORAL_TABLET | Freq: Every day | ORAL | 3 refills | Status: DC

## 2020-12-28 MED ORDER — PIOGLITAZONE HCL 30 MG PO TABS: 30.0000 mg | ORAL_TABLET | Freq: Every day | ORAL | 3 refills | Status: DC

## 2020-12-28 MED ORDER — ATORVASTATIN CALCIUM 40 MG PO TABS: 40.0000 mg | ORAL_TABLET | Freq: Every day | ORAL | 3 refills | Status: DC

## 2020-12-28 NOTE — Progress Notes (Signed)
Subjective:  Patient ID: Steven Tanner,  male    DOB: 1970/12/22  Age: 50 y.o.    CC: Medical Management of Chronic Issues   HPI Steven Tanner presents for   follow-up of elevated cholesterol. Doing well without complaints on current medication. Denies side effects  including myalgia and arthralgia and nausea. Currently no chest pain, shortness of breath or other cardiovascular related symptoms noted.  Follow-up of diabetes. Patient does check blood sugar at home. Readings run between 180 and 220 Patient denies symptoms such as excessive hunger or urinary frequency, excessive hunger, nausea No significant hypoglycemic spells noted. Medications reviewed. Pt reports taking them except for the Actos and lisinopril. "I am already taking all I can afford."  complication/adverse reaction today.    History Steven Tanner has a past medical history of Abscess, Depression, and GERD (gastroesophageal reflux disease).   Steven Tanner has a past surgical history that includes Incise and drain abcess and Pilonidal cyst excision (N/A, 11/02/2013).   His family history includes Diabetes in his father; Heart disease in his father; Hypertension in his father and mother.Steven Tanner reports that Steven Tanner quit smoking about 3 years ago. His smoking use included cigarettes and cigars. Steven Tanner started smoking about 25 years ago. Steven Tanner has a 20.00 pack-year smoking history. Steven Tanner has never used smokeless tobacco. Steven Tanner reports current alcohol use. Steven Tanner reports current drug use. Drug: Marijuana.  Current Outpatient Medications on File Prior to Visit  Medication Sig Dispense Refill   aspirin EC 81 MG tablet Take 1 tablet (81 mg total) by mouth daily. 90 tablet 3   sildenafil (REVATIO) 20 MG tablet Take 2-5 tabs PRN prior to sexual activity 50 tablet 5   naproxen (NAPROSYN) 375 MG tablet Take 1 tablet (375 mg total) by mouth 2 (two) times daily. (Patient not taking: Reported on 12/28/2020) 20 tablet 0   No current facility-administered medications on file  prior to visit.    ROS Review of Systems  Constitutional:  Negative for fever.  Respiratory:  Negative for shortness of breath.   Cardiovascular:  Negative for chest pain.  Musculoskeletal:  Negative for arthralgias.  Skin:  Negative for rash.   Objective:  BP 130/82   Pulse 92   Temp 97.7 F (36.5 C)   Ht $R'5\' 10"'Ur$  (1.778 m)   Wt 265 lb 6.4 oz (120.4 kg)   SpO2 99%   BMI 38.08 kg/m   BP Readings from Last 3 Encounters:  12/28/20 130/82  07/30/20 125/83  05/24/20 119/70    Wt Readings from Last 3 Encounters:  12/28/20 265 lb 6.4 oz (120.4 kg)  07/30/20 265 lb (120.2 kg)  05/24/20 275 lb 3.2 oz (124.8 kg)     Physical Exam Vitals reviewed.  Constitutional:      Appearance: Steven Tanner is well-developed.  HENT:     Head: Normocephalic and atraumatic.     Right Ear: External ear normal.     Left Ear: External ear normal.     Mouth/Throat:     Pharynx: No oropharyngeal exudate or posterior oropharyngeal erythema.  Eyes:     Pupils: Pupils are equal, round, and reactive to light.  Cardiovascular:     Rate and Rhythm: Normal rate and regular rhythm.     Heart sounds: No murmur heard. Pulmonary:     Effort: No respiratory distress.     Breath sounds: Normal breath sounds.  Musculoskeletal:     Cervical back: Normal range of motion and neck supple.  Neurological:  Mental Status: Steven Tanner is alert and oriented to person, place, and time.    Diabetic Foot Exam - Simple   No data filed       Assessment & Plan:   Steven Tanner was seen today for medical management of chronic issues.  Diagnoses and all orders for this visit:  Diabetes mellitus without complication ([place]) -     Bayer DCA Hb A1c Waived -     CBC with Differential/Platelet -     CMP14+EGFR -     glimepiride (AMARYL) 4 MG tablet; Take 1 tablet (4 mg total) by mouth daily with breakfast. -     metFORMIN (GLUCOPHAGE) 1000 MG tablet; Take 1 tablet (1,000 mg total) by mouth 2 (two) times daily with a meal.  Mixed  hyperlipidemia -     Lipid panel -     atorvastatin (LIPITOR) 40 MG tablet; Take 1 tablet (40 mg total) by mouth daily.  Other orders -     lisinopril (ZESTRIL) 10 MG tablet; Take 1 tablet (10 mg total) by mouth daily. To protect kidneys from diabetic damage -     pioglitazone (ACTOS) 30 MG tablet; Take 1 tablet (30 mg total) by mouth daily. To help with diabetes  I have changed [place] glimepiride. I am also having him maintain his aspirin EC, sildenafil, naproxen, atorvastatin, lisinopril, metFORMIN, and pioglitazone.  Meds ordered this encounter  Medications   atorvastatin (LIPITOR) 40 MG tablet    Sig: Take 1 tablet (40 mg total) by mouth daily.    Dispense:  90 tablet    Refill:  3   glimepiride (AMARYL) 4 MG tablet    Sig: Take 1 tablet (4 mg total) by mouth daily with breakfast.    Dispense:  90 tablet    Refill:  3   lisinopril (ZESTRIL) 10 MG tablet    Sig: Take 1 tablet (10 mg total) by mouth daily. To protect kidneys from diabetic damage    Dispense:  90 tablet    Refill:  3   metFORMIN (GLUCOPHAGE) 1000 MG tablet    Sig: Take 1 tablet (1,000 mg total) by mouth 2 (two) times daily with a meal.    Dispense:  180 tablet    Refill:  3   pioglitazone (ACTOS) 30 MG tablet    Sig: Take 1 tablet (30 mg total) by mouth daily. To help with diabetes    Dispense:  90 tablet    Refill:  3   Pt. Declines all blood tests including A1c on financial grounds today.  Follow-up: Return in about 3 months (around 03/30/2021).  Claretta Fraise, M.D.

## 2021-03-30 ENCOUNTER — Ambulatory Visit: Payer: Self-pay | Admitting: Family Medicine

## 2021-04-11 ENCOUNTER — Ambulatory Visit: Payer: Self-pay | Admitting: Family Medicine

## 2021-04-19 ENCOUNTER — Ambulatory Visit (INDEPENDENT_AMBULATORY_CARE_PROVIDER_SITE_OTHER): Payer: Self-pay | Admitting: Family Medicine

## 2021-04-19 ENCOUNTER — Encounter: Payer: Self-pay | Admitting: Family Medicine

## 2021-04-19 VITALS — BP 124/75 | HR 94 | Temp 97.5°F | Ht 70.0 in | Wt 261.2 lb

## 2021-04-19 DIAGNOSIS — E756 Lipid storage disorder, unspecified: Secondary | ICD-10-CM

## 2021-04-19 DIAGNOSIS — E1169 Type 2 diabetes mellitus with other specified complication: Secondary | ICD-10-CM

## 2021-04-19 DIAGNOSIS — E119 Type 2 diabetes mellitus without complications: Secondary | ICD-10-CM

## 2021-04-19 DIAGNOSIS — E782 Mixed hyperlipidemia: Secondary | ICD-10-CM

## 2021-04-19 LAB — BAYER DCA HB A1C WAIVED: HB A1C (BAYER DCA - WAIVED): 11.8 % — ABNORMAL HIGH (ref 4.8–5.6)

## 2021-04-19 MED ORDER — GLIMEPIRIDE 4 MG PO TABS: 4.0000 mg | ORAL_TABLET | Freq: Two times a day (BID) | ORAL | 3 refills | Status: DC

## 2021-04-19 NOTE — Progress Notes (Signed)
Subjective:  Patient ID: Steven Tanner,  male    DOB: Jun 30, 1970  Age: 51 y.o.    CC: Medical Management of Chronic Issues   HPI Steven Tanner presents for follow-up of elevated cholesterol. Doing well without complaints on current medication. Denies side effects  including myalgia and arthralgia and nausea. Also in today for liver function testing. Currently no chest pain, shortness of breath or other cardiovascular related symptoms noted.  Follow-up of diabetes. Patient does check blood sugar at home. Readings run between 160 and 170 random Patient denies symptoms such as excessive hunger or urinary frequency, excessive hunger, nausea No significant hypoglycemic spells noted. Medications reviewed. Pt reports taking them regularly. Pt. denies complication/adverse reaction today. Not taking the actos. ONly taking metformin qd.   History Steven Tanner has a past medical history of Abscess, Depression, and GERD (gastroesophageal reflux disease).   He has a past surgical history that includes Incise and drain abcess and Pilonidal cyst excision (N/A, 11/02/2013).   His family history includes Diabetes in his father; Heart disease in his father; Hypertension in his father and mother.He reports that he quit smoking about 3 years ago. His smoking use included cigarettes and cigars. He started smoking about 26 years ago. He has a 20.00 pack-year smoking history. He has never used smokeless tobacco. He reports current alcohol use. He reports current drug use. Drug: Marijuana.  Current Outpatient Medications on File Prior to Visit  Medication Sig Dispense Refill   aspirin EC 81 MG tablet Take 1 tablet (81 mg total) by mouth daily. 90 tablet 3   atorvastatin (LIPITOR) 40 MG tablet Take 1 tablet (40 mg total) by mouth daily. 90 tablet 3   lisinopril (ZESTRIL) 10 MG tablet Take 1 tablet (10 mg total) by mouth daily. To protect kidneys from diabetic damage 90 tablet 3   metFORMIN (GLUCOPHAGE) 1000 MG  tablet Take 1 tablet (1,000 mg total) by mouth 2 (two) times daily with a meal. 180 tablet 3   naproxen (NAPROSYN) 375 MG tablet Take 1 tablet (375 mg total) by mouth 2 (two) times daily. 20 tablet 0   sildenafil (REVATIO) 20 MG tablet Take 2-5 tabs PRN prior to sexual activity 50 tablet 5   No current facility-administered medications on file prior to visit.    ROS Review of Systems  Constitutional:  Negative for fever.  Respiratory:  Negative for shortness of breath.   Cardiovascular:  Negative for chest pain.  Musculoskeletal:  Negative for arthralgias.  Skin:  Negative for rash.   Objective:  BP 124/75    Pulse 94    Temp (!) 97.5 F (36.4 C)    Ht $R'5\' 10"'Tz$  (1.778 m)    Wt 261 lb 3.2 oz (118.5 kg)    SpO2 99%    BMI 37.48 kg/m   BP Readings from Last 3 Encounters:  04/19/21 124/75  12/28/20 130/82  07/30/20 125/83    Wt Readings from Last 3 Encounters:  04/19/21 261 lb 3.2 oz (118.5 kg)  12/28/20 265 lb 6.4 oz (120.4 kg)  07/30/20 265 lb (120.2 kg)     Physical Exam Vitals reviewed.  Constitutional:      Appearance: He is well-developed.  HENT:     Head: Normocephalic and atraumatic.     Right Ear: External ear normal.     Left Ear: External ear normal.     Mouth/Throat:     Pharynx: No oropharyngeal exudate or posterior oropharyngeal erythema.  Eyes:  Pupils: Pupils are equal, round, and reactive to light.  Cardiovascular:     Rate and Rhythm: Normal rate and regular rhythm.     Heart sounds: No murmur heard. Pulmonary:     Effort: No respiratory distress.     Breath sounds: Normal breath sounds.  Musculoskeletal:     Cervical back: Normal range of motion and neck supple.  Neurological:     Mental Status: He is alert and oriented to person, place, and time.    Diabetic Foot Exam - Simple   No data filed       Assessment & Plan:   Steven Tanner was seen today for medical management of chronic issues.  Diagnoses and all orders for this  visit:  Diabetic lipidosis (Steven Tanner) -     Bayer DCA Hb A1c Waived -     CBC with Differential/Platelet -     CMP14+EGFR  Mixed hyperlipidemia -     Lipid panel  Diabetes mellitus without complication ([facility]) -     glimepiride (AMARYL) 4 MG tablet; Take 1 tablet (4 mg total) by mouth 2 (two) times daily.   I have discontinued [place] pioglitazone. I have also changed his glimepiride. Additionally, I am having him maintain his aspirin EC, sildenafil, naproxen, atorvastatin, lisinopril, and metFORMIN.  Meds ordered this encounter  Medications   glimepiride (AMARYL) 4 MG tablet    Sig: Take 1 tablet (4 mg total) by mouth 2 (two) times daily.    Dispense:  180 tablet    Refill:  3     Follow-up: Return in about 3 months (around 07/17/2021).  Claretta Fraise, M.D.

## 2021-04-19 NOTE — Patient Instructions (Signed)

## 2021-04-20 LAB — CMP14+EGFR
ALT: 31 IU/L (ref 0–44)
AST: 15 IU/L (ref 0–40)
Albumin/Globulin Ratio: 1.8 (ref 1.2–2.2)
Albumin: 4.4 g/dL (ref 4.0–5.0)
Alkaline Phosphatase: 90 IU/L (ref 44–121)
BUN/Creatinine Ratio: 12 (ref 9–20)
BUN: 15 mg/dL (ref 6–24)
Bilirubin Total: 0.5 mg/dL (ref 0.0–1.2)
CO2: 21 mmol/L (ref 20–29)
Calcium: 9.4 mg/dL (ref 8.7–10.2)
Chloride: 102 mmol/L (ref 96–106)
Creatinine, Ser: 1.26 mg/dL (ref 0.76–1.27)
Globulin, Total: 2.5 g/dL (ref 1.5–4.5)
Glucose: 345 mg/dL — ABNORMAL HIGH (ref 70–99)
Potassium: 5.2 mmol/L (ref 3.5–5.2)
Sodium: 138 mmol/L (ref 134–144)
Total Protein: 6.9 g/dL (ref 6.0–8.5)
eGFR: 70 mL/min/{1.73_m2} (ref >59)

## 2021-04-20 LAB — CBC WITH DIFFERENTIAL/PLATELET
Basophils Absolute: 0.1 10*3/uL (ref 0.0–0.2)
Basos: 1 %
EOS (ABSOLUTE): 0.2 10*3/uL (ref 0.0–0.4)
Eos: 3 %
Hematocrit: 42.9 % (ref 37.5–51.0)
Hemoglobin: 14 g/dL (ref 13.0–17.7)
Immature Grans (Abs): 0 10*3/uL (ref 0.0–0.1)
Immature Granulocytes: 0 %
Lymphocytes Absolute: 3.1 10*3/uL (ref 0.7–3.1)
Lymphs: 40 %
MCH: 27 pg (ref 26.6–33.0)
MCHC: 32.6 g/dL (ref 31.5–35.7)
MCV: 83 fL (ref 79–97)
Monocytes Absolute: 0.7 10*3/uL (ref 0.1–0.9)
Monocytes: 9 %
Neutrophils Absolute: 3.6 10*3/uL (ref 1.4–7.0)
Neutrophils: 47 %
Platelets: 341 10*3/uL (ref 150–450)
RBC: 5.19 x10E6/uL (ref 4.14–5.80)
RDW: 13 % (ref 11.6–15.4)
WBC: 7.7 10*3/uL (ref 3.4–10.8)

## 2021-04-20 LAB — LIPID PANEL
Chol/HDL Ratio: 5.3 ratio — ABNORMAL HIGH (ref 0.0–5.0)
Cholesterol, Total: 143 mg/dL (ref 100–199)
HDL: 27 mg/dL — ABNORMAL LOW (ref >39)
LDL Chol Calc (NIH): 84 mg/dL (ref 0–99)
Triglycerides: 188 mg/dL — ABNORMAL HIGH (ref 0–149)
VLDL Cholesterol Cal: 32 mg/dL (ref 5–40)

## 2021-04-24 NOTE — Progress Notes (Signed)
Hello Rawlin,  Your lab result is normal and/or stable.Some minor variations that are not significant are commonly marked abnormal, but do not represent any medical problem for you.  Best regards, Allona Gondek, M.D.

## 2021-07-18 ENCOUNTER — Ambulatory Visit: Payer: Self-pay | Admitting: Family Medicine

## 2021-07-18 ENCOUNTER — Encounter: Payer: Self-pay | Admitting: Family Medicine

## 2021-07-18 VITALS — BP 115/68 | HR 101 | Temp 97.6°F | Ht 70.0 in | Wt 271.8 lb

## 2021-07-18 DIAGNOSIS — E119 Type 2 diabetes mellitus without complications: Secondary | ICD-10-CM

## 2021-07-18 DIAGNOSIS — E782 Mixed hyperlipidemia: Secondary | ICD-10-CM

## 2021-07-18 LAB — BAYER DCA HB A1C WAIVED: HB A1C (BAYER DCA - WAIVED): 7.6 % — ABNORMAL HIGH (ref 4.8–5.6)

## 2021-07-18 NOTE — Progress Notes (Signed)
? ?Subjective:  ?Patient ID: Steven Tanner, male    DOB: 1970-07-16  Age: 51 y.o. MRN: [id number] ? ?CC: Medical Management of Chronic Issues ? ? ?HPI ?Al Decant presents forFollow-up of diabetes. Patient checks blood sugar at home.  ?Normally around 85- 160. Last week or two has climbed to 200. Off his diet.  Patient denies symptoms such as polyuria, polydipsia, excessive hunger, nausea ?No significant hypoglycemic spells noted. ?Medications reviewed. Pt reports taking them regularly without complication/adverse reaction being reported today.  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 Eating too much. Drinking soda. Weight up 10 lb.  ? ?History ?Avel has a past medical history of Abscess, Depression, and GERD (gastroesophageal reflux disease).  ? ?He has a past surgical history that includes Incise and drain abcess and Pilonidal cyst excision (N/A, 11/02/2013).  ? ?His family history includes Diabetes in his father; Heart disease in his father; Hypertension in his father and mother.He reports that he quit smoking about 4 years ago. His smoking use included cigarettes and cigars. He started smoking about 26 years ago. He has a 20.00 pack-year smoking history. He has never used smokeless tobacco. He reports current alcohol use. He reports current drug use. Drug: Marijuana. ? ?Current Outpatient Medications on File Prior to Visit  ?Medication Sig Dispense Refill  ? aspirin EC 81 MG tablet Take 1 tablet (81 mg total) by mouth daily. 90 tablet 3  ? atorvastatin (LIPITOR) 40 MG tablet Take 1 tablet (40 mg total) by mouth daily. 90 tablet 3  ? glimepiride (AMARYL) 4 MG tablet Take 1 tablet (4 mg total) by mouth 2 (two) times daily. 180 tablet 3  ? lisinopril (ZESTRIL) 10 MG tablet Take 1 tablet (10 mg total) by mouth daily. To protect kidneys from diabetic damage 90 tablet 3  ? metFORMIN (GLUCOPHAGE) 1000 MG tablet Take 1  tablet (1,000 mg total) by mouth 2 (two) times daily with a meal. 180 tablet 3  ? naproxen (NAPROSYN) 375 MG tablet Take 1 tablet (375 mg total) by mouth 2 (two) times daily. 20 tablet 0  ? sildenafil (REVATIO) 20 MG tablet Take 2-5 tabs PRN prior to sexual activity 50 tablet 5  ? ?No current facility-administered medications on file prior to visit.  ? ? ?ROS ?Review of Systems  ?Constitutional:  Negative for fever.  ?Respiratory:  Negative for shortness of breath.   ?Cardiovascular:  Negative for chest pain.  ?Musculoskeletal:  Negative for arthralgias.  ?Skin:  Negative for rash.  ? ?Objective:  ?BP 115/68   Pulse (!) 101   Temp 97.6 ?F (36.4 ?C)   Ht _0  (1.778 m)   Wt 271 lb 12.8 oz (123.3 kg)   SpO2 96%   BMI 39.00 kg/m?  ? ?BP Readings from Last 3 Encounters:  ?07/18/21 115/68  ?04/19/21 124/75  ?12/28/20 130/82  ? ? ?Wt Readings from Last 3 Encounters:  ?07/18/21 271 lb 12.8 oz (123.3 kg)  ?04/19/21 261 lb 3.2 oz (118.5 kg)  ?12/28/20 265 lb 6.4 oz (120.4 kg)  ? ? ? ?Physical Exam ?Vitals reviewed.  ?Constitutional:   ?   Appearance: He is well-developed.  ?HENT:  ?   Head: Normocephalic and atraumatic.  ?   Right Ear: External ear normal.  ?   Left Ear: External ear normal.  ?   Mouth/Throat:  ?  Pharynx: No oropharyngeal exudate or posterior oropharyngeal erythema.  ?Eyes:  ?   Pupils: Pupils are equal, round, and reactive to light.  ?Cardiovascular:  ?   Rate and Rhythm: Normal rate and regular rhythm.  ?   Heart sounds: No murmur heard. ?Pulmonary:  ?   Effort: No respiratory distress.  ?   Breath sounds: Normal breath sounds.  ?Musculoskeletal:  ?   Cervical back: Normal range of motion and neck supple.  ?Neurological:  ?   Mental Status: He is alert and oriented to person, place, and time.  ? ? ? ? ?Assessment & Plan:  ? ?Dozier was seen today for medical management of chronic issues. ? ?Diagnoses and all orders for this visit: ? ?Diabetes mellitus without complication ([place]) ?-     Bayer DCA Hb  A1c Waived ?-     CBC with Differential/Platelet ?-     CMP14+EGFR ? ?Mixed hyperlipidemia ?-     Lipid panel ? ? ?Pt. Joined [employer]. Encouraged him with regular workout.  ? ? ?I am having Al Decant maintain his aspirin EC, sildenafil, naproxen, atorvastatin, lisinopril, metFORMIN, and glimepiride. ? ?No orders of the defined types were placed in this encounter. ? ? ? ?Follow-up: Return in about 3 months (around 10/18/2021). ? ?Claretta Fraise, M.D. ?

## 2021-07-19 LAB — CMP14+EGFR
ALT: 44 IU/L (ref 0–44)
AST: 20 IU/L (ref 0–40)
Albumin/Globulin Ratio: 1.5 (ref 1.2–2.2)
Albumin: 4.2 g/dL (ref 4.0–5.0)
Alkaline Phosphatase: 79 IU/L (ref 44–121)
BUN/Creatinine Ratio: 15 (ref 9–20)
BUN: 17 mg/dL (ref 6–24)
Bilirubin Total: 0.4 mg/dL (ref 0.0–1.2)
CO2: 20 mmol/L (ref 20–29)
Calcium: 10 mg/dL (ref 8.7–10.2)
Chloride: 100 mmol/L (ref 96–106)
Creatinine, Ser: 1.12 mg/dL (ref 0.76–1.27)
Globulin, Total: 2.8 g/dL (ref 1.5–4.5)
Glucose: 211 mg/dL — ABNORMAL HIGH (ref 70–99)
Potassium: 4.5 mmol/L (ref 3.5–5.2)
Sodium: 138 mmol/L (ref 134–144)
Total Protein: 7 g/dL (ref 6.0–8.5)
eGFR: 81 mL/min/{1.73_m2} (ref >59)

## 2021-07-19 LAB — CBC WITH DIFFERENTIAL/PLATELET
Basophils Absolute: 0.1 10*3/uL (ref 0.0–0.2)
Basos: 1 %
EOS (ABSOLUTE): 0.3 10*3/uL (ref 0.0–0.4)
Eos: 3 %
Hematocrit: 40.8 % (ref 37.5–51.0)
Hemoglobin: 13.4 g/dL (ref 13.0–17.7)
Immature Grans (Abs): 0 10*3/uL (ref 0.0–0.1)
Immature Granulocytes: 0 %
Lymphocytes Absolute: 3.5 10*3/uL — ABNORMAL HIGH (ref 0.7–3.1)
Lymphs: 43 %
MCH: 27.5 pg (ref 26.6–33.0)
MCHC: 32.8 g/dL (ref 31.5–35.7)
MCV: 84 fL (ref 79–97)
Monocytes Absolute: 0.9 10*3/uL (ref 0.1–0.9)
Monocytes: 11 %
Neutrophils Absolute: 3.5 10*3/uL (ref 1.4–7.0)
Neutrophils: 42 %
Platelets: 315 10*3/uL (ref 150–450)
RBC: 4.88 x10E6/uL (ref 4.14–5.80)
RDW: 13.2 % (ref 11.6–15.4)
WBC: 8.2 10*3/uL (ref 3.4–10.8)

## 2021-07-19 LAB — LIPID PANEL
Chol/HDL Ratio: 5.5 ratio — ABNORMAL HIGH (ref 0.0–5.0)
Cholesterol, Total: 148 mg/dL (ref 100–199)
HDL: 27 mg/dL — ABNORMAL LOW (ref >39)
LDL Chol Calc (NIH): 86 mg/dL (ref 0–99)
Triglycerides: 204 mg/dL — ABNORMAL HIGH (ref 0–149)
VLDL Cholesterol Cal: 35 mg/dL (ref 5–40)

## 2021-10-19 ENCOUNTER — Encounter: Payer: Self-pay | Admitting: Family Medicine

## 2021-10-19 ENCOUNTER — Ambulatory Visit (INDEPENDENT_AMBULATORY_CARE_PROVIDER_SITE_OTHER): Payer: Self-pay | Admitting: Family Medicine

## 2021-10-19 VITALS — BP 111/75 | HR 90 | Temp 97.7°F | Ht 70.0 in | Wt 274.2 lb

## 2021-10-19 DIAGNOSIS — E782 Mixed hyperlipidemia: Secondary | ICD-10-CM

## 2021-10-19 DIAGNOSIS — E1165 Type 2 diabetes mellitus with hyperglycemia: Secondary | ICD-10-CM

## 2021-10-19 DIAGNOSIS — E119 Type 2 diabetes mellitus without complications: Secondary | ICD-10-CM

## 2021-10-19 LAB — BAYER DCA HB A1C WAIVED: HB A1C (BAYER DCA - WAIVED): 11.8 % — ABNORMAL HIGH (ref 4.8–5.6)

## 2021-10-19 NOTE — Progress Notes (Signed)
Subjective:  Patient ID: Steven Tanner, male    DOB: [birth year]  Age: 51 y.o. MRN: [id number]  CC: Medical Management of Chronic Issues   HPI TEJAS SEAWOOD presents forFollow-up of diabetes. Patient checks blood sugar at home.   AROUND 200 fasting.  Patient denies symptoms such as polyuria, polydipsia, excessive hunger, nausea No significant hypoglycemic spells noted. Medications reviewed. Pt reports taking them regularly without complication/adverse reaction being reported today. He is off diet. HAs a lot of bills to pay. 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     History Gerrick has a past medical history of Abscess, Depression, and GERD (gastroesophageal reflux disease).   He has a past surgical history that includes Incise and drain abcess and Pilonidal cyst excision (N/A, 11/02/2013).   His family history includes Diabetes in his father; Heart disease in his father; Hypertension in his father and mother.He reports that he quit smoking about 4 years ago. His smoking use included cigarettes and cigars. He started smoking about 26 years ago. He has a 20.00 pack-year smoking history. He has never used smokeless tobacco. He reports current alcohol use. He reports current drug use. Drug: Marijuana.  Current Outpatient Medications on File Prior to Visit  Medication Sig Dispense Refill   aspirin EC 81 MG tablet Take 1 tablet (81 mg total) by mouth daily. 90 tablet 3   glimepiride (AMARYL) 4 MG tablet Take 1 tablet (4 mg total) by mouth 2 (two) times daily. 180 tablet 3   naproxen (NAPROSYN) 375 MG tablet Take 1 tablet (375 mg total) by mouth 2 (two) times daily. 20 tablet 0   sildenafil (REVATIO) 20 MG tablet Take 2-5 tabs PRN prior to sexual activity 50 tablet 5   No current facility-administered medications on file prior to visit.    ROS Review of Systems  Constitutional:  Negative for fever.  Respiratory:  Negative for  shortness of breath.   Cardiovascular:  Negative for chest pain.  Musculoskeletal:  Negative for arthralgias.  Skin:  Negative for rash.    Objective:  BP 111/75   Pulse 90   Temp 97.7 F (36.5 C)   Ht $R'5\' 10"'zv$  (1.778 m)   Wt 274 lb 3.2 oz (124.4 kg)   SpO2 96%   BMI 39.34 kg/m   BP Readings from Last 3 Encounters:  10/19/21 111/75  07/18/21 115/68  04/19/21 124/75    Wt Readings from Last 3 Encounters:  10/19/21 274 lb 3.2 oz (124.4 kg)  07/18/21 271 lb 12.8 oz (123.3 kg)  04/19/21 261 lb 3.2 oz (118.5 kg)     Physical Exam Vitals reviewed.  Constitutional:      Appearance: He is well-developed.  HENT:     Head: Normocephalic and atraumatic.     Right Ear: External ear normal.     Left Ear: External ear normal.     Mouth/Throat:     Pharynx: No oropharyngeal exudate or posterior oropharyngeal erythema.  Eyes:     Pupils: Pupils are equal, round, and reactive to light.  Cardiovascular:     Rate and Rhythm: Normal rate and regular rhythm.     Heart sounds: No murmur heard. Pulmonary:     Effort: No respiratory distress.     Breath sounds: Normal breath sounds.  Musculoskeletal:     Cervical back: Normal range of motion and neck supple.  Neurological:  Mental Status: He is alert and oriented to person, place, and time.       Assessment & Plan:   Wayman was seen today for medical management of chronic issues.  Diagnoses and all orders for this visit:  Uncontrolled type 2 diabetes mellitus with hyperglycemia ([place]) -     Bayer DCA Hb A1c Waived -     CBC with Differential/Platelet -     CMP14+EGFR -     metFORMIN (GLUCOPHAGE) 1000 MG tablet; Take 1 tablet (1,000 mg total) by mouth 2 (two) times daily with a meal.  Mixed hyperlipidemia -     Lipid panel -     atorvastatin (LIPITOR) 40 MG tablet; Take 1 tablet (40 mg total) by mouth daily.  Other orders -     lisinopril (ZESTRIL) 10 MG tablet; Take 1 tablet (10 mg total) by mouth daily. To protect  kidneys from diabetic damage    Samples of ozempic given, 2 weeks at .25 then two weeks at  then has a 1 month sample of 1 mg.   I am having Al Decant maintain his aspirin EC, sildenafil, naproxen, glimepiride, atorvastatin, lisinopril, and metFORMIN.  Meds ordered this encounter  Medications   atorvastatin (LIPITOR) 40 MG tablet    Sig: Take 1 tablet (40 mg total) by mouth daily.    Dispense:  90 tablet    Refill:  3   lisinopril (ZESTRIL) 10 MG tablet    Sig: Take 1 tablet (10 mg total) by mouth daily. To protect kidneys from diabetic damage    Dispense:  90 tablet    Refill:  3   metFORMIN (GLUCOPHAGE) 1000 MG tablet    Sig: Take 1 tablet (1,000 mg total) by mouth 2 (two) times daily with a meal.    Dispense:  180 tablet    Refill:  3     Follow-up: Return in about 3 months (around 01/19/2022).  Claretta Fraise, M.D.

## 2021-10-20 ENCOUNTER — Encounter: Payer: Self-pay | Admitting: Family Medicine

## 2021-10-20 LAB — CMP14+EGFR
ALT: 44 IU/L (ref 0–44)
AST: 21 IU/L (ref 0–40)
Albumin/Globulin Ratio: 2 (ref 1.2–2.2)
Albumin: 4.3 g/dL (ref 4.1–5.1)
Alkaline Phosphatase: 81 IU/L (ref 44–121)
BUN/Creatinine Ratio: 11 (ref 9–20)
BUN: 13 mg/dL (ref 6–24)
Bilirubin Total: 0.5 mg/dL (ref 0.0–1.2)
CO2: 21 mmol/L (ref 20–29)
Calcium: 9.2 mg/dL (ref 8.7–10.2)
Chloride: 101 mmol/L (ref 96–106)
Creatinine, Ser: 1.15 mg/dL (ref 0.76–1.27)
Globulin, Total: 2.2 g/dL (ref 1.5–4.5)
Glucose: 331 mg/dL — ABNORMAL HIGH (ref 70–99)
Potassium: 4.5 mmol/L (ref 3.5–5.2)
Sodium: 137 mmol/L (ref 134–144)
Total Protein: 6.5 g/dL (ref 6.0–8.5)
eGFR: 78 mL/min/{1.73_m2} (ref >59)

## 2021-10-20 LAB — CBC WITH DIFFERENTIAL/PLATELET
Basophils Absolute: 0 10*3/uL (ref 0.0–0.2)
Basos: 1 %
EOS (ABSOLUTE): 0.3 10*3/uL (ref 0.0–0.4)
Eos: 4 %
Hematocrit: 42.1 % (ref 37.5–51.0)
Hemoglobin: 13.8 g/dL (ref 13.0–17.7)
Immature Grans (Abs): 0 10*3/uL (ref 0.0–0.1)
Immature Granulocytes: 0 %
Lymphocytes Absolute: 3.1 10*3/uL (ref 0.7–3.1)
Lymphs: 46 %
MCH: 27 pg (ref 26.6–33.0)
MCHC: 32.8 g/dL (ref 31.5–35.7)
MCV: 82 fL (ref 79–97)
Monocytes Absolute: 0.7 10*3/uL (ref 0.1–0.9)
Monocytes: 10 %
Neutrophils Absolute: 2.6 10*3/uL (ref 1.4–7.0)
Neutrophils: 39 %
Platelets: 299 10*3/uL (ref 150–450)
RBC: 5.12 x10E6/uL (ref 4.14–5.80)
RDW: 12.7 % (ref 11.6–15.4)
WBC: 6.7 10*3/uL (ref 3.4–10.8)

## 2021-10-20 LAB — LIPID PANEL
Chol/HDL Ratio: 5.6 ratio — ABNORMAL HIGH (ref 0.0–5.0)
Cholesterol, Total: 139 mg/dL (ref 100–199)
HDL: 25 mg/dL — ABNORMAL LOW (ref >39)
LDL Chol Calc (NIH): 82 mg/dL (ref 0–99)
Triglycerides: 183 mg/dL — ABNORMAL HIGH (ref 0–149)
VLDL Cholesterol Cal: 32 mg/dL (ref 5–40)

## 2021-10-20 MED ORDER — LISINOPRIL 10 MG PO TABS: 10.0000 mg | ORAL_TABLET | Freq: Every day | ORAL | 3 refills | Status: DC

## 2021-10-20 MED ORDER — ATORVASTATIN CALCIUM 40 MG PO TABS: 40.0000 mg | ORAL_TABLET | Freq: Every day | ORAL | 3 refills | Status: DC

## 2021-10-20 MED ORDER — METFORMIN HCL 1000 MG PO TABS: 1000.0000 mg | ORAL_TABLET | Freq: Two times a day (BID) | ORAL | 3 refills | Status: DC

## 2021-10-20 NOTE — Progress Notes (Signed)
Hello Andrw,  Your lab result is normal and/or stable.Some minor variations that are not significant are commonly marked abnormal, but do not represent any medical problem for you.  Best regards, Myesha Stillion, M.D.

## 2021-11-27 ENCOUNTER — Other Ambulatory Visit: Payer: Self-pay | Admitting: Family Medicine

## 2021-11-27 ENCOUNTER — Telehealth: Payer: Self-pay | Admitting: Family Medicine

## 2021-11-27 MED ORDER — ONDANSETRON 8 MG PO TBDP: 8.0000 mg | ORAL_TABLET | Freq: Four times a day (QID) | ORAL | 1 refills | Status: DC | PRN

## 2021-11-27 NOTE — Telephone Encounter (Signed)
Please let the patient know that I sent their prescription to their pharmacy. Thanks, WS 

## 2021-11-27 NOTE — Telephone Encounter (Signed)
Pt called stating that after he took his 3 wk application of ozempic he starting having real bad indigestion and bad stomach pains. Wants to know if he should stop taking the medicine or give advise on what to do.

## 2021-11-27 NOTE — Telephone Encounter (Signed)
Reduce dose to .25 (If not already there) use OTC prilosec daily. I can send in ondansetron for nausea.

## 2021-11-27 NOTE — Telephone Encounter (Signed)
Pt aware.

## 2021-11-27 NOTE — Telephone Encounter (Signed)
Pt aware of recommendations

## 2021-12-13 ENCOUNTER — Telehealth: Payer: Self-pay | Admitting: Family Medicine

## 2021-12-13 NOTE — Telephone Encounter (Signed)
Per Dr Livia Snellen note from 11/27/21, patient is to go back to .25

## 2021-12-13 NOTE — Telephone Encounter (Signed)
Patient has questions about his ozempic pen, he is wanting to know what dose he needs to take and needs to take today. Please call back.

## 2021-12-13 NOTE — Telephone Encounter (Signed)
PATIENT AWARE, STATES HE WILL BE OUT AFTER THIS DOSE, WILL NEED SENT TO PHARMACY FOR NEXT WEEK AND WILL NEED TO KNOW IF DOSE WILL CHANGE

## 2021-12-18 ENCOUNTER — Other Ambulatory Visit: Payer: Self-pay | Admitting: Family Medicine

## 2021-12-18 MED ORDER — OZEMPIC (0.25 OR 0.5 MG/DOSE) 2 MG/3ML ~~LOC~~ SOPN: 0.2500 mg | PEN_INJECTOR | SUBCUTANEOUS | 1 refills | Status: DC

## 2021-12-18 NOTE — Telephone Encounter (Signed)
Please let the patient know that I sent their prescription to their pharmacy. Thanks, WS 

## 2021-12-19 NOTE — Telephone Encounter (Signed)
Patient is calling back about samples on Ozempic 0.25. Was not told when nurse called this morning. Needs to take shot in the morning.

## 2021-12-19 NOTE — Telephone Encounter (Signed)
Patient aware.

## 2021-12-19 NOTE — Telephone Encounter (Signed)
PA was approved but patient states med is $400 is requesting sample, do we have any available?

## 2021-12-19 NOTE — Telephone Encounter (Signed)
Your request has been approved Effective from 12/19/2021 through 12/18/2022.

## 2021-12-19 NOTE — Telephone Encounter (Signed)
Patient called [employer] to get PA on Ozempic and they gave him [phone] to speed up PA because he needs to take shot tomorrow. Can he get samples to get him through til PA is done. Please call. Patient is taking .25

## 2021-12-20 NOTE — Telephone Encounter (Signed)
Pt check in on this message. 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

## 2021-12-20 NOTE — Telephone Encounter (Signed)
SAMPLE GIVEN OZEMPIC INCREASE TO 0.'5MG'$  WEEKLY

## 2022-01-22 ENCOUNTER — Ambulatory Visit (INDEPENDENT_AMBULATORY_CARE_PROVIDER_SITE_OTHER): Payer: BC Managed Care – PPO | Admitting: Family Medicine

## 2022-01-22 ENCOUNTER — Encounter: Payer: Self-pay | Admitting: Family Medicine

## 2022-01-22 VITALS — BP 111/70 | HR 99 | Temp 98.3°F | Ht 70.0 in | Wt 261.2 lb

## 2022-01-22 DIAGNOSIS — Z1211 Encounter for screening for malignant neoplasm of colon: Secondary | ICD-10-CM

## 2022-01-22 DIAGNOSIS — E1165 Type 2 diabetes mellitus with hyperglycemia: Secondary | ICD-10-CM | POA: Diagnosis not present

## 2022-01-22 DIAGNOSIS — K625 Hemorrhage of anus and rectum: Secondary | ICD-10-CM

## 2022-01-22 DIAGNOSIS — E782 Mixed hyperlipidemia: Secondary | ICD-10-CM

## 2022-01-22 DIAGNOSIS — Z125 Encounter for screening for malignant neoplasm of prostate: Secondary | ICD-10-CM

## 2022-01-22 LAB — BAYER DCA HB A1C WAIVED: HB A1C (BAYER DCA - WAIVED): 9 % — ABNORMAL HIGH (ref 4.8–5.6)

## 2022-01-22 MED ORDER — FREESTYLE LIBRE 3 SENSOR MISC: 11 refills | Status: DC

## 2022-01-22 NOTE — Progress Notes (Signed)
Subjective:  Patient ID: Steven Tanner, male    DOB: November 24, 1970  Age: 51 y.o. MRN: [id number]  CC: Medical Management of Chronic Issues   HPI Steven Tanner presents forFollow-up of diabetes. Patient checks blood sugar at home. Having gas, sour burps with 0.5 ozempic for the last 3 shots. Had Constipation early on. Now stools are loose. Normal volume.   150-200 fasting and  postprandial Patient denies symptoms such as polyuria, polydipsia, excessive hunger, nausea No significant hypoglycemic spells noted. Medications reviewed. Pt reports taking them regularly without complication/adverse reaction being reported today.    History Steven Tanner has a past medical history of Abscess, Depression, and GERD (gastroesophageal reflux disease).   Steven Tanner has a past surgical history that includes Incise and drain abcess and Pilonidal cyst excision (N/A, 11/02/2013).   His family history includes Diabetes in his father; Heart disease in his father; Hypertension in his father and mother.Steven Tanner reports that Steven Tanner quit smoking about 4 years ago. His smoking use included cigarettes and cigars. Steven Tanner started smoking about 26 years ago. Steven Tanner has a 20.00 pack-year smoking history. Steven Tanner has never used smokeless tobacco. Steven Tanner reports current alcohol use. Steven Tanner reports current drug use. Drug: Marijuana.  Current Outpatient Medications on File Prior to Visit  Medication Sig Dispense Refill   aspirin EC 81 MG tablet Take 1 tablet (81 mg total) by mouth daily. 90 tablet 3   atorvastatin (LIPITOR) 40 MG tablet Take 1 tablet (40 mg total) by mouth daily. 90 tablet 3   glimepiride (AMARYL) 4 MG tablet Take 1 tablet (4 mg total) by mouth 2 (two) times daily. 180 tablet 3   lisinopril (ZESTRIL) 10 MG tablet Take 1 tablet (10 mg total) by mouth daily. To protect kidneys from diabetic damage 90 tablet 3   metFORMIN (GLUCOPHAGE) 1000 MG tablet Take 1 tablet (1,000 mg total) by mouth 2 (two) times daily with a meal. 180 tablet 3   naproxen  (NAPROSYN) 375 MG tablet Take 1 tablet (375 mg total) by mouth 2 (two) times daily. 20 tablet 0   ondansetron (ZOFRAN-ODT) 8 MG disintegrating tablet Take 1 tablet (8 mg total) by mouth every 6 (six) hours as needed for nausea or vomiting. 20 tablet 1   Semaglutide,0.25 or 0.5MG/DOS, (OZEMPIC, 0.25 OR 0.5 MG/DOSE,) 2 MG/3ML SOPN Inject 0.25 mg into the skin once a week. 3 mL 1   sildenafil (REVATIO) 20 MG tablet Take 2-5 tabs PRN prior to sexual activity 50 tablet 5   No current facility-administered medications on file prior to visit.    ROS Review of Systems  Constitutional:  Negative for fever.  Respiratory:  Negative for shortness of breath.   Cardiovascular:  Negative for chest pain.  Gastrointestinal:  Positive for blood in stool (squirts out with BM for years).  Musculoskeletal:  Negative for arthralgias.  Skin:  Negative for rash.    Objective:  BP 111/70   Pulse 99   Temp 98.3 F (36.8 C)   Ht _0  (1.778 m)   Wt 261 lb 3.2 oz (118.5 kg)   SpO2 96%   BMI 37.48 kg/m   BP Readings from Last 3 Encounters:  01/22/22 111/70  10/19/21 111/75  07/18/21 115/68    Wt Readings from Last 3 Encounters:  01/22/22 261 lb 3.2 oz (118.5 kg)  10/19/21 274 lb 3.2 oz (124.4 kg)  07/18/21 271 lb 12.8 oz (123.3 kg)     Physical Exam Vitals reviewed.  Constitutional:  Appearance: Steven Tanner is well-developed.  HENT:     Head: Normocephalic and atraumatic.     Right Ear: External ear normal.     Left Ear: External ear normal.     Mouth/Throat:     Pharynx: No oropharyngeal exudate or posterior oropharyngeal erythema.  Eyes:     Pupils: Pupils are equal, round, and reactive to light.  Cardiovascular:     Rate and Rhythm: Normal rate and regular rhythm.     Heart sounds: No murmur heard. Pulmonary:     Effort: No respiratory distress.     Breath sounds: Normal breath sounds.  Musculoskeletal:     Cervical back: Normal range of motion and neck supple.  Neurological:      Mental Status: Steven Tanner is alert and oriented to person, place, and time.       Assessment & Plan:   Steven Tanner was seen today for medical management of chronic issues.  Diagnoses and all orders for this visit:  Uncontrolled type 2 diabetes mellitus with hyperglycemia ([place]) -     Bayer DCA Hb A1c Waived -     CBC with Differential/Platelet -     CMP14+EGFR -     Microalbumin / creatinine urine ratio  Mixed hyperlipidemia -     Lipid panel  Screen for colon cancer -     Ambulatory referral to Gastroenterology  Screening for prostate cancer -     PSA, total and free  Painless rectal bleeding  Other orders -     Continuous Blood Gluc Sensor (FREESTYLE LIBRE 3 SENSOR) MISC; Place 1 sensor on the skin every 14 days. Use to check glucose continuously      I am having Steven Tanner start on FreeStyle Libre 3 Sensor. I am also having Steven Tanner maintain his aspirin EC, sildenafil, naproxen, glimepiride, atorvastatin, lisinopril, metFORMIN, ondansetron, and Ozempic (0.25 or 0.5 MG/DOSE).  Meds ordered this encounter  Medications   Continuous Blood Gluc Sensor (FREESTYLE LIBRE 3 SENSOR) MISC    Sig: Place 1 sensor on the skin every 14 days. Use to check glucose continuously    Dispense:  2 each    Refill:  11    Continue Ozempic at 0.5. Use Gas Ex for sour burps. Report abd pain.  Follow-up: Return in about 3 months (around 04/24/2022).  Claretta Fraise, M.D.

## 2022-01-23 LAB — LIPID PANEL
Chol/HDL Ratio: 4.1 ratio (ref 0.0–5.0)
Cholesterol, Total: 123 mg/dL (ref 100–199)
HDL: 30 mg/dL — ABNORMAL LOW (ref >39)
LDL Chol Calc (NIH): 79 mg/dL (ref 0–99)
Triglycerides: 64 mg/dL (ref 0–149)
VLDL Cholesterol Cal: 14 mg/dL (ref 5–40)

## 2022-01-23 LAB — CMP14+EGFR
ALT: 34 IU/L (ref 0–44)
AST: 20 IU/L (ref 0–40)
Albumin/Globulin Ratio: 2 (ref 1.2–2.2)
Albumin: 4.5 g/dL (ref 4.1–5.1)
Alkaline Phosphatase: 70 IU/L (ref 44–121)
BUN/Creatinine Ratio: 7 — ABNORMAL LOW (ref 9–20)
BUN: 8 mg/dL (ref 6–24)
Bilirubin Total: 0.3 mg/dL (ref 0.0–1.2)
CO2: 21 mmol/L (ref 20–29)
Calcium: 9.5 mg/dL (ref 8.7–10.2)
Chloride: 105 mmol/L (ref 96–106)
Creatinine, Ser: 1.08 mg/dL (ref 0.76–1.27)
Globulin, Total: 2.2 g/dL (ref 1.5–4.5)
Glucose: 164 mg/dL — ABNORMAL HIGH (ref 70–99)
Potassium: 4.7 mmol/L (ref 3.5–5.2)
Sodium: 141 mmol/L (ref 134–144)
Total Protein: 6.7 g/dL (ref 6.0–8.5)
eGFR: 84 mL/min/{1.73_m2} (ref >59)

## 2022-01-23 LAB — CBC WITH DIFFERENTIAL/PLATELET
Basophils Absolute: 0.1 10*3/uL (ref 0.0–0.2)
Basos: 1 %
EOS (ABSOLUTE): 0.2 10*3/uL (ref 0.0–0.4)
Eos: 4 %
Hematocrit: 40.2 % (ref 37.5–51.0)
Hemoglobin: 13.3 g/dL (ref 13.0–17.7)
Immature Grans (Abs): 0 10*3/uL (ref 0.0–0.1)
Immature Granulocytes: 0 %
Lymphocytes Absolute: 2.1 10*3/uL (ref 0.7–3.1)
Lymphs: 38 %
MCH: 27.2 pg (ref 26.6–33.0)
MCHC: 33.1 g/dL (ref 31.5–35.7)
MCV: 82 fL (ref 79–97)
Monocytes Absolute: 0.5 10*3/uL (ref 0.1–0.9)
Monocytes: 9 %
Neutrophils Absolute: 2.7 10*3/uL (ref 1.4–7.0)
Neutrophils: 48 %
Platelets: 321 10*3/uL (ref 150–450)
RBC: 4.89 x10E6/uL (ref 4.14–5.80)
RDW: 13.9 % (ref 11.6–15.4)
WBC: 5.6 10*3/uL (ref 3.4–10.8)

## 2022-01-24 ENCOUNTER — Encounter: Payer: Self-pay | Admitting: *Deleted

## 2022-01-25 NOTE — Progress Notes (Signed)
Hello [place],  Your lab result is normal and/or stable.Some minor variations that are not significant are commonly marked abnormal, but do not represent any medical problem for you.  Best regards, Claretta Fraise, M.D.

## 2022-02-07 ENCOUNTER — Telehealth: Payer: Self-pay | Admitting: *Deleted

## 2022-02-07 NOTE — Telephone Encounter (Signed)
Received pt triage questionnaire. Marked he has blood in stool. He will need OV. Thanks!

## 2022-02-20 NOTE — Progress Notes (Signed)
GI Office Note    Referring Provider: Mechele Claude, MD Primary Care Physician:  Mechele Claude, MD  Primary Gastroenterologist: Hennie Duos. Marletta Lor, DO  Chief Complaint   Chief Complaint  Patient presents with   Blood In Stools    Blood in stool, see it in the toilet. Sometimes it's dark red and sometimes it's bright red. Not sure if he has hemorrhoids.    History of Present Illness   Steven Tanner is a 51 y.o. male presenting today at the request of [name], [name], MD for evaluation of blood in stools prior to scheduling screening colonoscopy.  Patient noted seeing blood in stools on colonoscopy questionnaire. Has seen blood in the toilet, sometimes dark red and sometimes bright red. Symptoms have been going on for years. Bleeding may last for 3-4 days and then may happen twice a month of so. Typically has about 2 BM per days.   Denies constipation or diarrhea. Used to lift heavy weights. Denies frequent squatting. Does admit to sitting 20-30 minutes on the commode at a time. Does not rush his time of the commode. Denies abdominal pain, has some here and there. Generalized pain. Denies nausea, vomiting, dysphagia. Recently started on ozempic and had some lack of appetite that its improving. Has been on it for about 4 months. Had lost 19lbs and has gained some of it back.   Takes NSAIDs only as needed, rare occasions.   Does have a history of indigestion, not as bad as it used to be. Has taken various over the counter PPIs. Does not take anything daily. Usually will take if he may eat pizza or a typical trigger food. Maybe takes it 2-3 times per month. Symptoms primarily occur with tomato based foods.   Has not been taking glimepiride for a few weeks.  Blood sugars have been fairly well controlled.   Family history of colon cancer or colon polyps - None he is aware of.  Does smoke 4-5 cigarettes per day. Social alcohol use.     Current Outpatient Medications  Medication Sig  Dispense Refill   aspirin EC 81 MG tablet Take 1 tablet (81 mg total) by mouth daily. 90 tablet 3   atorvastatin (LIPITOR) 40 MG tablet Take 1 tablet (40 mg total) by mouth daily. 90 tablet 3   Continuous Blood Gluc Sensor (FREESTYLE LIBRE 3 SENSOR) MISC Place 1 sensor on the skin every 14 days. Use to check glucose continuously 2 each 11   lisinopril (ZESTRIL) 10 MG tablet Take 1 tablet (10 mg total) by mouth daily. To protect kidneys from diabetic damage 90 tablet 3   metFORMIN (GLUCOPHAGE) 1000 MG tablet Take 1 tablet (1,000 mg total) by mouth 2 (two) times daily with a meal. 180 tablet 3   Semaglutide,0.25 or 0.5MG /DOS, (OZEMPIC, 0.25 OR 0.5 MG/DOSE,) 2 MG/3ML SOPN Inject 0.25 mg into the skin once a week. 3 mL 1   sildenafil (REVATIO) 20 MG tablet Take 2-5 tabs PRN prior to sexual activity 50 tablet 5   glimepiride (AMARYL) 4 MG tablet Take 1 tablet (4 mg total) by mouth 2 (two) times daily. (Patient not taking: Reported on 02/21/2022) 180 tablet 3   naproxen (NAPROSYN) 375 MG tablet Take 1 tablet (375 mg total) by mouth 2 (two) times daily. (Patient not taking: Reported on 02/21/2022) 20 tablet 0   ondansetron (ZOFRAN-ODT) 8 MG disintegrating tablet Take 1 tablet (8 mg total) by mouth every 6 (six) hours as needed for nausea or vomiting. (  Patient not taking: Reported on 02/21/2022) 20 tablet 1   No current facility-administered medications for this visit.    Past Medical History:  Diagnosis Date   Abscess    Depression    GERD (gastroesophageal reflux disease)     Past Surgical History:  Procedure Laterality Date   INCISE AND DRAIN ABCESS     pilonodil cyst-[facility]   PILONIDAL CYST EXCISION N/A 11/02/2013   Procedure: CYST EXCISION PILONIDAL SIMPLE;  Surgeon: Dalia Heading, MD;  Location: [facility];  Service: General;  Laterality: N/A;    Family History  Problem Relation Age of Onset   Hypertension Mother    Diabetes Father    Hypertension Father    Heart disease Father      Allergies as of 02/21/2022 - Review Complete 02/21/2022  Allergen Reaction Noted   Codeine Hives, Itching, and Swelling 02/15/2012   Sulfa antibiotics Rash 09/24/2013    Social History   Socioeconomic History   Marital status: Single    Spouse name: Not on file   Number of children: Not on file   Years of education: Not on file   Highest education level: Not on file  Occupational History   Not on file  Tobacco Use   Smoking status: Former    Packs/day: 1.00    Years: 20.00    Total pack years: 20.00    Types: Cigarettes, Cigars    Start date: 02/15/1995    Quit date: 07/17/2017    Years since quitting: 4.6   Smokeless tobacco: Never  Vaping Use   Vaping Use: Some days  Substance and Sexual Activity   Alcohol use: Yes    Comment: rare   Drug use: Yes    Types: Marijuana    Comment: weekly- last used 10/29/2013   Sexual activity: Yes    Partners: Female  Other Topics Concern   Not on file  Social History Narrative   Not on file   Social Determinants of Health   Financial Resource Strain: Not on file  Food Insecurity: Not on file  Transportation Needs: Not on file  Physical Activity: Not on file  Stress: Not on file  Social Connections: Not on file  Intimate Partner Violence: Not on file     Review of Systems   Gen: Denies any fever, chills, fatigue, weight loss, lack of appetite.  CV: Denies chest pain, heart palpitations, peripheral edema, syncope.  Resp: Denies shortness of breath at rest or with exertion. Denies wheezing or cough.  GI: see HPI GU : Denies urinary burning, urinary frequency, urinary hesitancy MS: Denies joint pain, muscle weakness, cramps, or limitation of movement.  Derm: Denies rash, itching, dry skin Psych: Denies depression, anxiety, memory loss, and confusion Heme: Denies bruising, bleeding, and enlarged lymph nodes.   Physical Exam   BP 118/79 (BP Location: Right Arm, Patient Position: Sitting, Cuff Size: Large)   Pulse  94   Temp 97.7 F (36.5 C) (Temporal)   Ht 5\' 10"  (1.778 m)   Wt 264 lb (119.7 kg)   SpO2 96%   BMI 37.88 kg/m   General:   Alert and oriented. Pleasant and cooperative. Well-nourished and well-developed.  Head:  Normocephalic and atraumatic. Eyes:  Without icterus, sclera clear and conjunctiva pink.  Ears:  Normal auditory acuity. Mouth:  No deformity or lesions, oral mucosa pink.  Lungs:  Clear to auscultation bilaterally. No wheezes, rales, or rhonchi. No distress.  Heart:  S1, S2 present without murmurs appreciated.  Abdomen:  +  BS, soft, non-tender and non-distended. No HSM noted. No guarding or rebound. No masses appreciated.  Rectal: good tone, exam difficult given patients tone, possible hemorrhoids present.  Msk:  Symmetrical without gross deformities. Normal posture. Extremities:  Without edema. Neurologic:  Alert and  oriented x4;  grossly normal neurologically. Skin:  Intact without significant lesions or rashes. Psych:  Alert and cooperative. Normal mood and affect.   Assessment   Steven Tanner is a 51 y.o. male with a history of GERD, diabetes, and depression presenting today for evaluation of blood in stools and need for screening colonoscopy.  Blood in stools: Likely benign anorectal source of hemorrhoids.  Does have a history of heavy lifting and spends about 20-30 minutes on the toilet at a time.  Does have bright red blood, and sometimes dark red.  Denies any overt melena.  Symptoms usually occur about twice per month, sometimes last 1-3 days.  Good rectal tone on exam, no presence of obvious blood today.  Given his tone, exam was difficult to detect hemorrhoids, likely has some hemorrhoidal tissue right posterior hemorrhoid column.  Anoscopy would be more beneficial, patient would likely not tolerate. Is due for screening colonoscopy, therefore we will proceed for further evaluation.  Advised he may use over-the-counter hemorrhoid cream.  GERD: Has occasional  symptoms, likely triggered by tomato based foods.  Uses over-the-counter PPI as needed.  Denies any dysphagia, nausea, or vomiting.  I have recommended for him to use famotidine over-the-counter as needed for reflux.  Also advised to avoid NSAIDs.  Tobacco cessation is recommended.   Screening for colon cancer: Due for first-ever colonoscopy.  Denies any constipation, diarrhea, melena, lack of appetite.  No alarm symptoms present.  Will proceed with TCS.   PLAN   Proceed with colonoscopy with propofol by Dr. Marletta Lor in near future: the risks, benefits, and alternatives have been discussed with the patient in detail. The patient states understanding and desires to proceed. ASA 2 Hold Ozempic for 1 week Hold metformin and glimepiride night prior to and morning of procedure GERD diet/ lifestyle modifications Famotidine as needed for reflux. Hemorrhoid precautions Avoid NSAIDs Follow up 2-3 months post procedure.    Brooke Bonito, MSN, FNP-BC, AGACNP-BC [facility]

## 2022-02-21 ENCOUNTER — Encounter: Payer: Self-pay | Admitting: *Deleted

## 2022-02-21 ENCOUNTER — Encounter: Payer: Self-pay | Admitting: Gastroenterology

## 2022-02-21 ENCOUNTER — Ambulatory Visit (INDEPENDENT_AMBULATORY_CARE_PROVIDER_SITE_OTHER): Payer: Self-pay | Admitting: Gastroenterology

## 2022-02-21 VITALS — BP 118/79 | HR 94 | Temp 97.7°F | Ht 70.0 in | Wt 264.0 lb

## 2022-02-21 DIAGNOSIS — Z1211 Encounter for screening for malignant neoplasm of colon: Secondary | ICD-10-CM

## 2022-02-21 DIAGNOSIS — K625 Hemorrhage of anus and rectum: Secondary | ICD-10-CM

## 2022-02-21 MED ORDER — PEG 3350-KCL-NA BICARB-NACL 420 G PO SOLR: 4000.0000 mL | Freq: Once | ORAL | 0 refills | Status: AC

## 2022-02-21 NOTE — Patient Instructions (Addendum)
Please avoid straining. You should limit your toilet time to 2-3 minutes at the most.   You may use an over the counter hemorrhoid cream such as preparation H when you have episodes of rectal bleeding.  We are scheduling you for colonoscopy in the near future with Dr. Abbey Chatters.  You need to hold your Ozempic for 1 week prior to procedure, your procedure should be on day 8 or after.  You need to hold your glimepiride and metformin the night prior to and the morning of the procedure.  Avoid all NSAIDs (ibuprofen, Aleve, Advil, BC Goody powders, meloxicam, Voltaren, or Celebrex). Follow a GERD diet:  Avoid fried, fatty, greasy, spicy, citrus foods. Avoid caffeine and carbonated beverages. Avoid chocolate. Try eating 4-6 small meals a day rather than 3 large meals. Do not eat within 3 hours of laying down. Prop head of bed up on wood or bricks to create a 6 inch incline. Cessation of tobacco.  I would recommend you take famotidine over-the-counter as needed for any reflux symptoms that occur.  You can take this in place of over-the-counter omeprazole.  I we have a great upcoming holiday season!  We will see for follow-up in 2-3 months after your procedure.  It was a pleasure to see you today. I want to create trusting relationships with patients. If you receive a survey regarding your visit,  I greatly appreciate you taking time to fill this out on paper or through your MyChart. I value your feedback.  Venetia Night, MSN, FNP-BC, AGACNP-BC [facility]

## 2022-03-17 IMAGING — DX DG CHEST 2V
2 series · 2 of 2 positions shown · non-contrast
Comparison: none

CLINICAL DATA: Pt c/o chest pain and cough x [REDACTED]. Pt denies
any SOB. No previous surgery to chest. No pending Covid test.

EXAM:
CHEST - 2 VIEW

[chest pa]
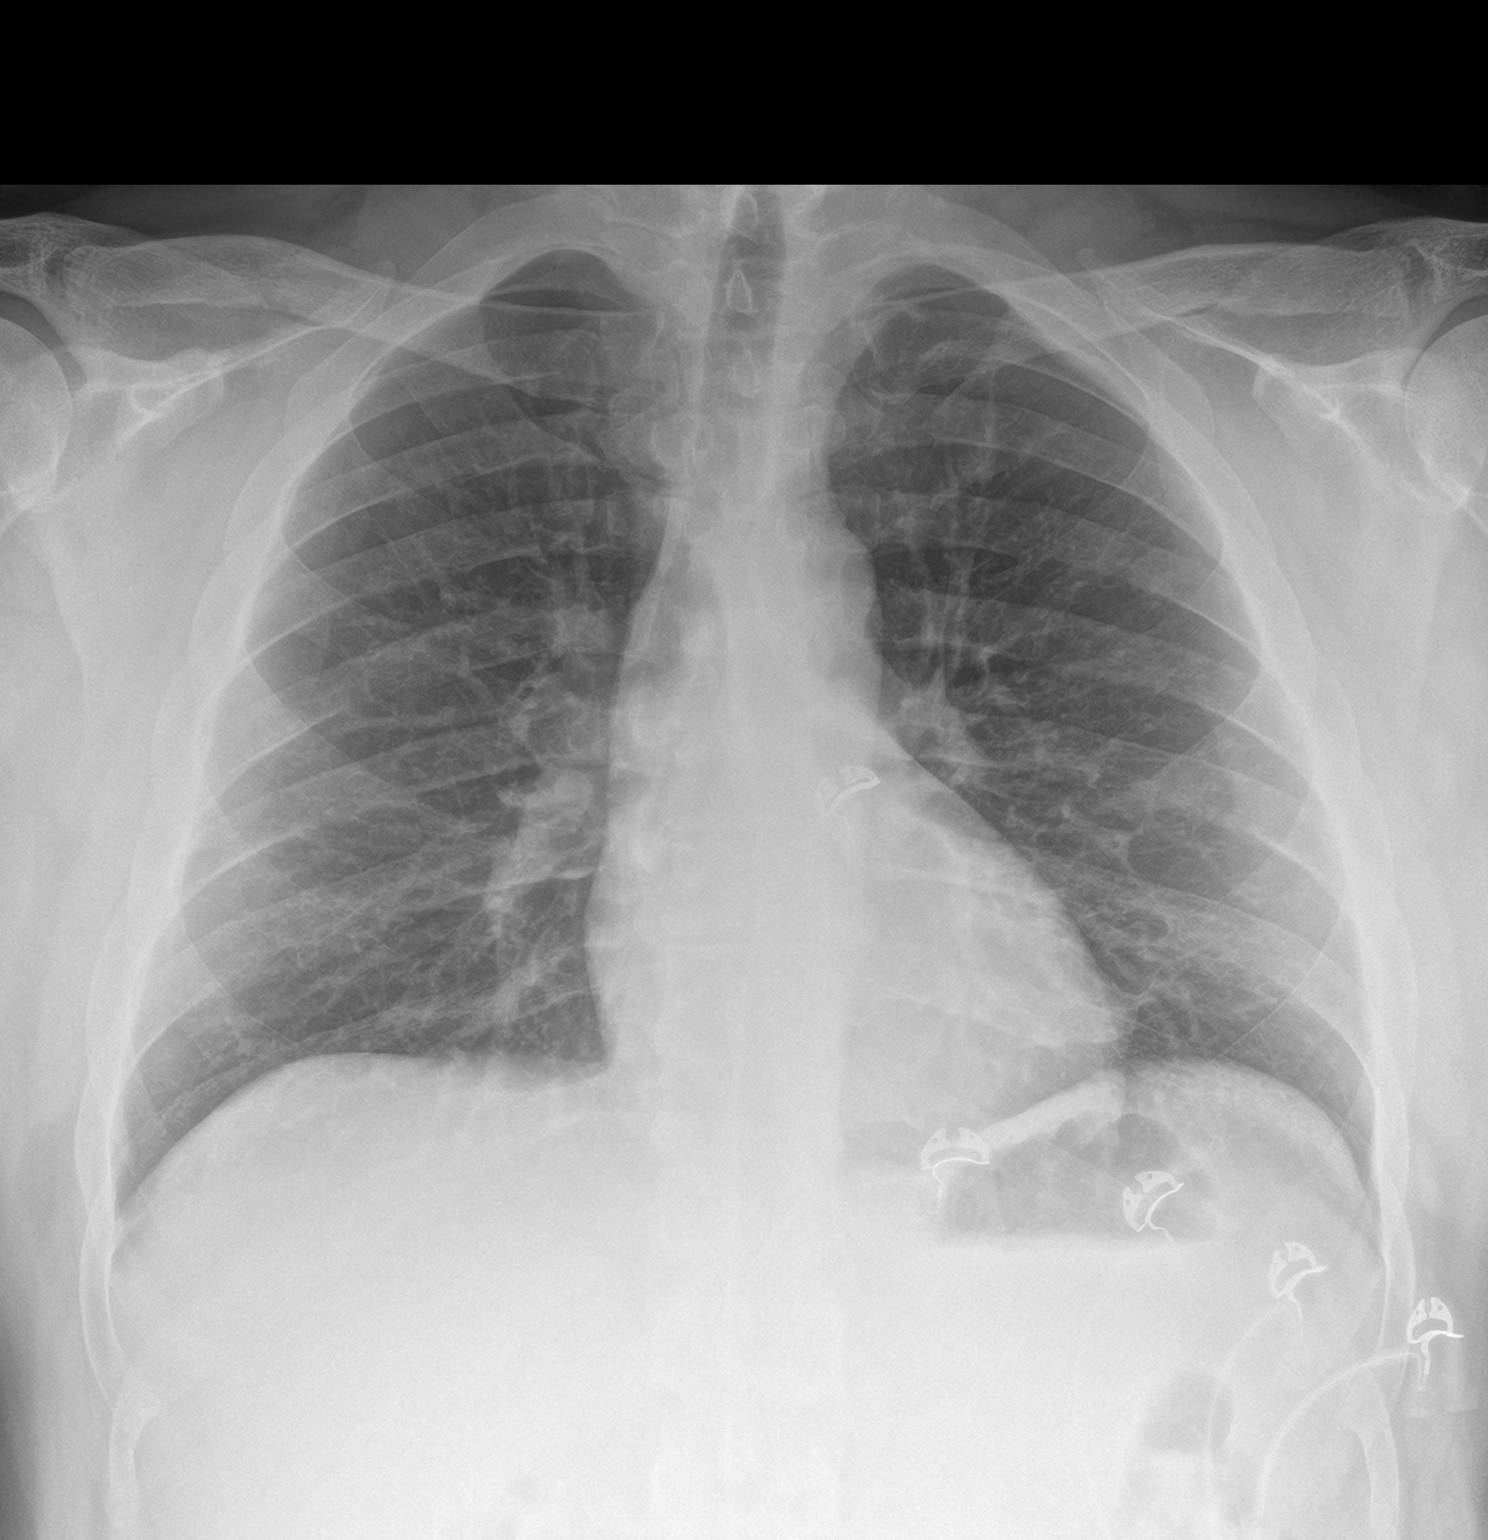

[chest lat]
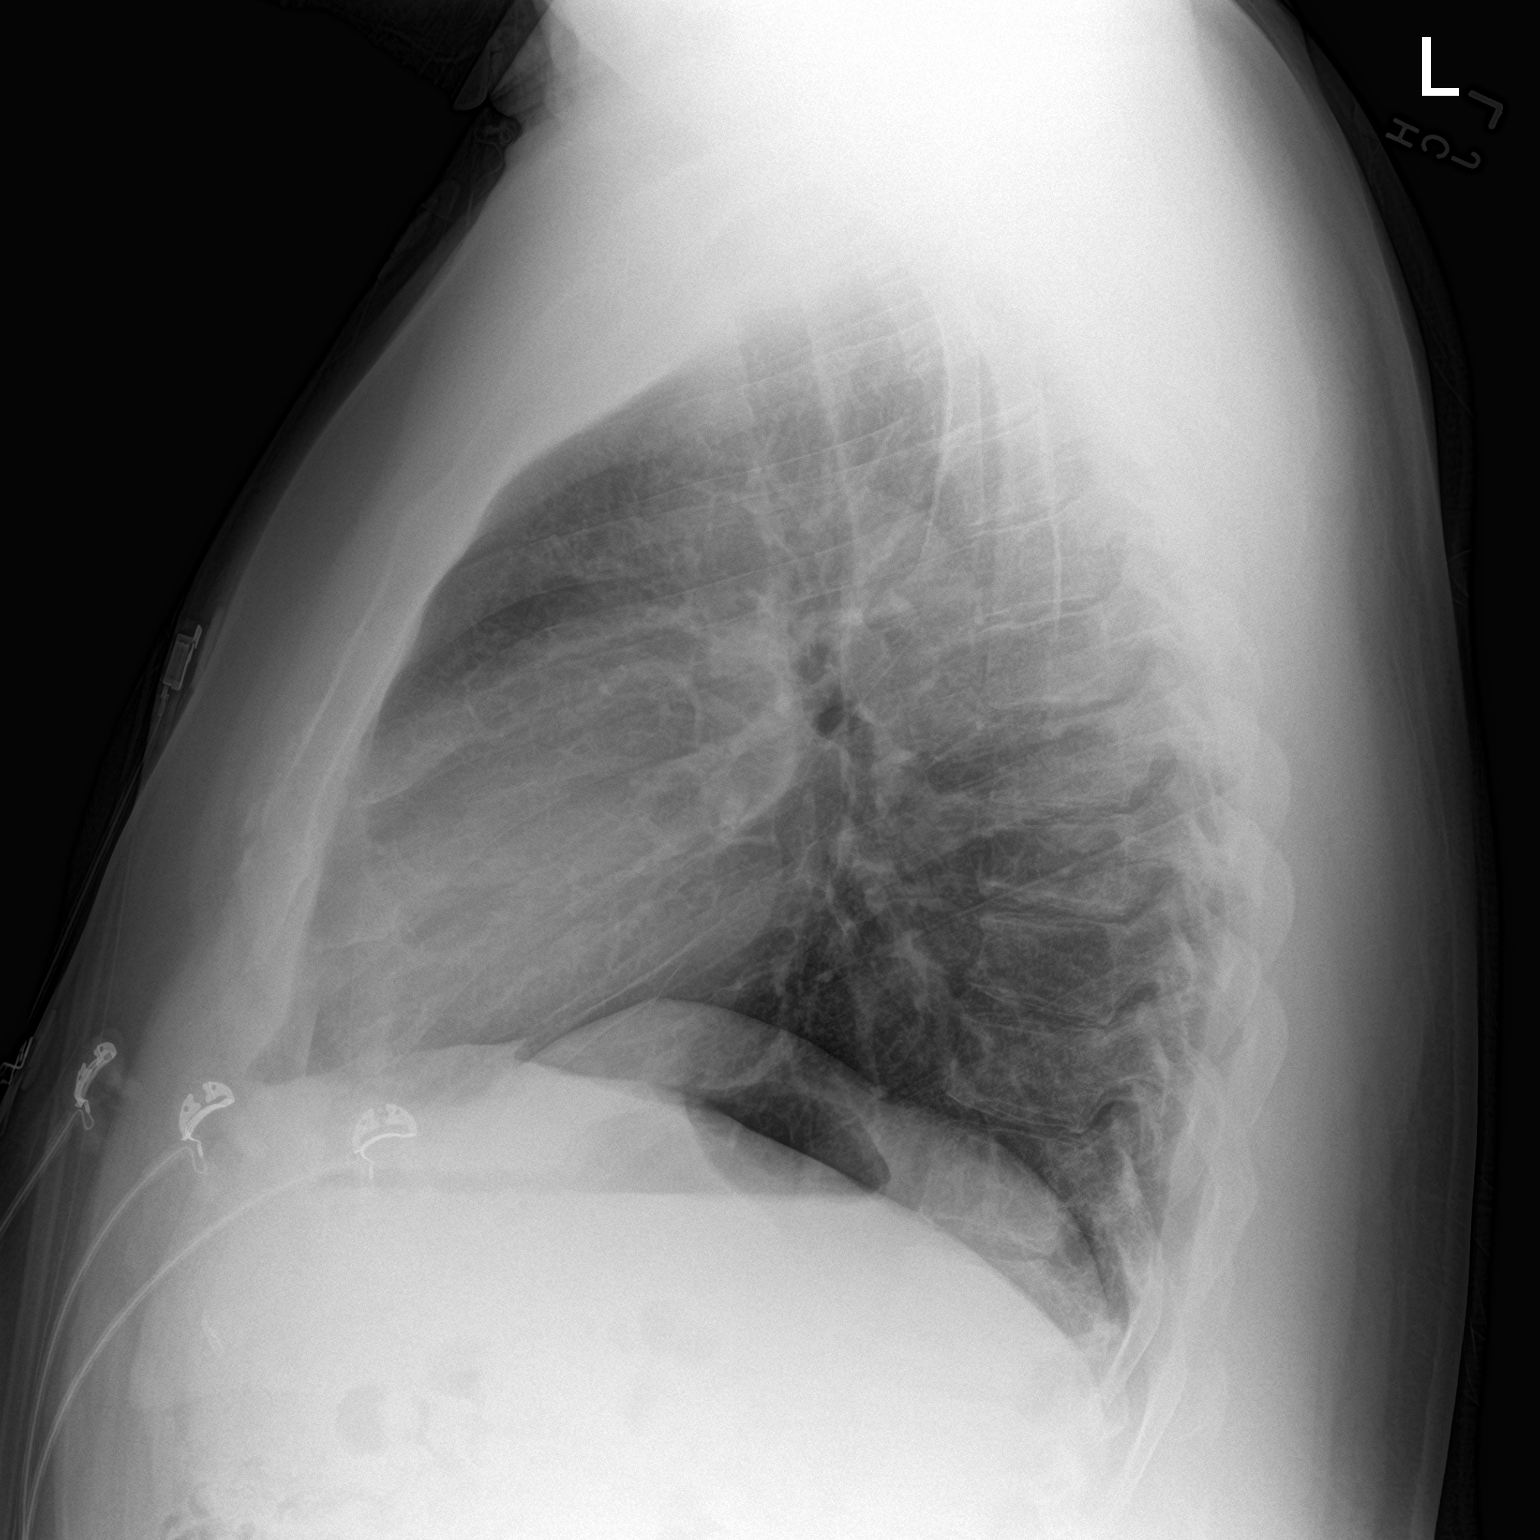

[2 of 2 positions shown; findings below may reference images not displayed]

FINDINGS: Lungs are clear.

Heart size and mediastinal contours are within normal limits.

No effusion.  No pneumothorax.

Visualized bones unremarkable.
IMPRESSION: No acute cardiopulmonary disease.

## 2022-03-21 ENCOUNTER — Telehealth: Payer: Self-pay | Admitting: Family Medicine

## 2022-03-21 MED ORDER — OZEMPIC (0.25 OR 0.5 MG/DOSE) 2 MG/3ML ~~LOC~~ SOPN: 0.2500 mg | PEN_INJECTOR | SUBCUTANEOUS | 1 refills | Status: DC

## 2022-03-21 NOTE — Telephone Encounter (Signed)
  Prescription Request  03/21/2022  Is this a "Controlled Substance" medicine?   Have you seen your PCP in the last 2 weeks?   If YES, route message to pool  -  If NO, patient needs to be scheduled for appointment.  What is the name of the medication or equipment?  Semaglutide,0.25 or 0.'5MG'$ /DOS, (OZEMPIC, 0.25 OR 0.5 MG/DOSE,) 2 MG/3ML SOPN    Have you contacted your pharmacy to request a refill? yes   Which pharmacy would you like this sent to? [facility] in [place]   Patient notified that their request is being sent to the clinical staff for review and that they should receive a response within 2 business days.

## 2022-03-22 ENCOUNTER — Encounter: Payer: Self-pay | Admitting: *Deleted

## 2022-03-22 ENCOUNTER — Telehealth: Payer: Self-pay | Admitting: *Deleted

## 2022-03-22 MED ORDER — PEG 3350-KCL-NA BICARB-NACL 420 G PO SOLR: 4000.0000 mL | Freq: Once | ORAL | 0 refills | Status: AC

## 2022-03-22 NOTE — Telephone Encounter (Signed)
Vaught, Tatem Holsonback W, RN  Shelitha Magley L, LPN I called to do preop interview and he was supposed to stop ozempic 7 days prior and took his shot yesterday.  I notified Dr. Rick Duff and he said the case had to be cancelled.  Patient is aware but would like someone to notify him of new procedure date today if possible so he can work it out with his work schedule.  Thank you.  Pt has been rescheduled for 04/03/22 at 12:45 pm. New instructions sent via MyChart and new rx for prep sent in.

## 2022-03-22 NOTE — OR Nursing (Signed)
Called patient for pre op interview and patient said he had his ozempic shot on 03/21/22. Was supposed to be on hold for seven days.  Dr. Rick Duff notified and said case had to be cancelled.

## 2022-03-22 NOTE — Telephone Encounter (Signed)
Pt called and stated he did not receive his instructions for his prep and he ate 2 hot dogs last night and started drinking prep at 8 am this morning. He said he called the hospital and they told him to stop the prep and give our office a call.  Advised pt that I spoke with provider and she said as long as he doesn't eat anything and sticks with a clear liquid diet, he should be fine for the colonoscopy tomorrow. If he isn't cleaned out well enough he needs to call and let us know. Pt verbalized understanding and instructions sent to pt via MyChart.

## 2022-04-03 ENCOUNTER — Encounter (HOSPITAL_COMMUNITY): Payer: Self-pay

## 2022-04-03 ENCOUNTER — Ambulatory Visit (HOSPITAL_COMMUNITY)
Admission: RE | Admit: 2022-04-03 | Discharge: 2022-04-03 | Disposition: A | Discharge status: Home / Self Care | Payer: BC Managed Care – PPO | Source: Ambulatory Visit | Attending: Internal Medicine | Admitting: Internal Medicine

## 2022-04-03 ENCOUNTER — Encounter (HOSPITAL_COMMUNITY)
Admission: RE | Disposition: A | Discharge status: Home / Self Care | Payer: Self-pay | Source: Ambulatory Visit | Attending: Internal Medicine

## 2022-04-03 ENCOUNTER — Ambulatory Visit (HOSPITAL_COMMUNITY): Payer: BC Managed Care – PPO | Admitting: Anesthesiology

## 2022-04-03 ENCOUNTER — Other Ambulatory Visit: Payer: Self-pay

## 2022-04-03 DIAGNOSIS — K648 Other hemorrhoids: Secondary | ICD-10-CM | POA: Insufficient documentation

## 2022-04-03 DIAGNOSIS — Z1211 Encounter for screening for malignant neoplasm of colon: Secondary | ICD-10-CM | POA: Diagnosis not present

## 2022-04-03 DIAGNOSIS — K5730 Diverticulosis of large intestine without perforation or abscess without bleeding: Secondary | ICD-10-CM | POA: Insufficient documentation

## 2022-04-03 DIAGNOSIS — K219 Gastro-esophageal reflux disease without esophagitis: Secondary | ICD-10-CM | POA: Insufficient documentation

## 2022-04-03 DIAGNOSIS — Z87891 Personal history of nicotine dependence: Secondary | ICD-10-CM | POA: Diagnosis not present

## 2022-04-03 DIAGNOSIS — F32A Depression, unspecified: Secondary | ICD-10-CM | POA: Diagnosis not present

## 2022-04-03 DIAGNOSIS — K635 Polyp of colon: Secondary | ICD-10-CM | POA: Diagnosis not present

## 2022-04-03 DIAGNOSIS — Z6837 Body mass index (BMI) 37.0-37.9, adult: Secondary | ICD-10-CM | POA: Diagnosis not present

## 2022-04-03 DIAGNOSIS — D120 Benign neoplasm of cecum: Secondary | ICD-10-CM | POA: Diagnosis not present

## 2022-04-03 DIAGNOSIS — E119 Type 2 diabetes mellitus without complications: Secondary | ICD-10-CM | POA: Diagnosis not present

## 2022-04-03 HISTORY — PX: COLONOSCOPY WITH PROPOFOL: SHX5780

## 2022-04-03 HISTORY — PX: POLYPECTOMY: SHX5525

## 2022-04-03 LAB — GLUCOSE, CAPILLARY: Glucose-Capillary: 184 mg/dL — ABNORMAL HIGH (ref 70–99)

## 2022-04-03 SURGERY — COLONOSCOPY WITH PROPOFOL
Anesthesia: General

## 2022-04-03 MED ORDER — LIDOCAINE HCL (CARDIAC) PF 100 MG/5ML IV SOSY
PREFILLED_SYRINGE | INTRAVENOUS | Status: DC | PRN
  Administered 2022-04-03: 60 mg via INTRAVENOUS

## 2022-04-03 MED ORDER — LACTATED RINGERS IV SOLN: INTRAVENOUS | Status: DC | PRN

## 2022-04-03 MED ORDER — PROPOFOL 10 MG/ML IV BOLUS
INTRAVENOUS | Status: DC | PRN
  Administered 2022-04-03 (×2): 50 mg via INTRAVENOUS
  Administered 2022-04-03: 40 mg via INTRAVENOUS
  Administered 2022-04-03 (×2): 50 mg via INTRAVENOUS
  Administered 2022-04-03: 110 mg via INTRAVENOUS

## 2022-04-03 NOTE — Op Note (Signed)
[facility] Patient Name: Steven Tanner Procedure Date: 04/03/2022 10:08 AM MRN: [id number] Date of Birth: [birth year] Attending MD: Elon Alas. Abbey Chatters , [place], [id number] CSN: [id number] Age: 52 Admit Type: Outpatient Procedure:                Colonoscopy Indications:              Screening for colorectal malignant neoplasm Providers:                Elon Alas. Abbey Chatters, DO, Charlsie Quest. Theda Sers RN, RN,                            Illene Labrador Referring MD:              Medicines:                See the Anesthesia note for documentation of the                            administered medications Complications:            No immediate complications. Estimated Blood Loss:     Estimated blood loss was minimal. Procedure:                Pre-Anesthesia Assessment:                           - The anesthesia plan was to use monitored                            anesthesia care (MAC).                           After obtaining informed consent, the colonoscope                            was passed under direct vision. Throughout the                            procedure, the patient's blood pressure, pulse, and                            oxygen saturations were monitored continuously. The                            PCF-HQ190L ([id number]) scope was introduced through                            the anus and advanced to the the cecum, identified                            by appendiceal orifice and ileocecal valve. The                            colonoscopy was performed without difficulty. The                            patient tolerated the procedure  well. The quality                            of the bowel preparation was evaluated using the                            BBPS [facility] Bowel Preparation Scale) with scores                            of: Right Colon = 2 (minor amount of residual                            staining, small fragments of stool and/or opaque                            liquid,  but mucosa seen well), Transverse Colon = 3                            (entire mucosa seen well with no residual staining,                            small fragments of stool or opaque liquid) and Left                            Colon = 3 (entire mucosa seen well with no residual                            staining, small fragments of stool or opaque                            liquid). The total BBPS score equals 8. The quality                            of the bowel preparation was good. Scope In: 10:17:02 AM Scope Out: 10:48:09 AM Scope Withdrawal Time: 0 hours 28 minutes 54 seconds  Total Procedure Duration: 0 hours 31 minutes 7 seconds  Findings:      The perianal and digital rectal examinations were normal.      Non-bleeding internal hemorrhoids were found during endoscopy.      A 6 mm polyp was found in the cecum. The polyp was sessile. The polyp       was removed with a cold snare. Resection and retrieval were complete.      A large amount of semi-solid stool was found at the hepatic flexure, in       the ascending colon and in the cecum, making visualization difficult.       Lavage of the area was performed using copious amounts of sterile water,       resulting in clearance with fair visualization.      A few small-mouthed diverticula were found in the hepatic flexure. Impression:               - Non-bleeding internal hemorrhoids.                           -  One 6 mm polyp in the cecum, removed with a cold                            snare. Resected and retrieved.                           - Stool at the hepatic flexure, in the ascending                            colon and in the cecum.                           - Diverticulosis at the hepatic flexure. Moderate Sedation:      Per Anesthesia Care Recommendation:           - Patient has a contact number available for                            emergencies. The signs and symptoms of potential                            delayed  complications were discussed with the                            patient. Return to normal activities tomorrow.                            Written discharge instructions were provided to the                            patient.                           - Resume previous diet.                           - Continue present medications.                           - Await pathology results.                           - Repeat colonoscopy in 5 years for surveillance.                           - Return to GI clinic PRN for hemorrhoid banding Procedure Code(s):        --- Professional ---                           [phone], Colonoscopy, flexible; with removal of                            tumor(s), polyp(s), or other lesion(s) by snare                            technique Diagnosis Code(s):        ---  Professional ---                           Z12.11, Encounter for screening for malignant                            neoplasm of colon                           D12.0, Benign neoplasm of cecum                           K64.8, Other hemorrhoids CPT copyright 2022 American Medical Association. All rights reserved. The codes documented in this report are preliminary and upon coder review may  be revised to meet current compliance requirements. Elon Alas. Abbey Chatters, DO [place] Abbey Chatters, DO 04/03/2022 10:50:57 AM This report has been signed electronically. Number of Addenda: 0

## 2022-04-03 NOTE — Anesthesia Preprocedure Evaluation (Signed)
Anesthesia Evaluation  Patient identified by MRN, date of birth, ID band Patient awake    Reviewed: Allergy & Precautions, H&P , NPO status , Patient's Chart, lab work & pertinent test results, reviewed documented beta blocker date and time   Airway Mallampati: II  TM Distance: >3 FB Neck ROM: full    Dental no notable dental hx.    Pulmonary neg pulmonary ROS, former smoker   Pulmonary exam normal breath sounds clear to auscultation       Cardiovascular Exercise Tolerance: Good negative cardio ROS  Rhythm:regular Rate:Normal     Neuro/Psych  PSYCHIATRIC DISORDERS  Depression    negative neurological ROS  negative psych ROS   GI/Hepatic negative GI ROS, Neg liver ROS,GERD  ,,  Endo/Other  negative endocrine ROSdiabetes, Type 2  Morbid obesity  Renal/GU negative Renal ROS  negative genitourinary   Musculoskeletal   Abdominal   Peds  Hematology negative hematology ROS (+)   Anesthesia Other Findings   Reproductive/Obstetrics negative OB ROS                             Anesthesia Physical Anesthesia Plan  ASA: 3  Anesthesia Plan: General   Post-op Pain Management:    Induction:   PONV Risk Score and Plan: Propofol infusion  Airway Management Planned:   Additional Equipment:   Intra-op Plan:   Post-operative Plan:   Informed Consent: I have reviewed the patients History and Physical, chart, labs and discussed the procedure including the risks, benefits and alternatives for the proposed anesthesia with the patient or authorized representative who has indicated his/her understanding and acceptance.     Dental Advisory Given  Plan Discussed with: CRNA  Anesthesia Plan Comments:        Anesthesia Quick Evaluation

## 2022-04-03 NOTE — Discharge Instructions (Addendum)
  Colonoscopy Discharge Instructions  Read the instructions outlined below and refer to this sheet in the next few weeks. These discharge instructions provide you with general information on caring for yourself after you leave the hospital. Your doctor may also give you specific instructions. While your treatment has been planned according to the most current medical practices available, unavoidable complications occasionally occur.   ACTIVITY You may resume your regular activity, but move at a slower pace for the next 24 hours.  Take frequent rest periods for the next 24 hours.  Walking will help get rid of the air and reduce the bloated feeling in your belly (abdomen).  No driving for 24 hours (because of the medicine (anesthesia) used during the test).   Do not sign any important legal documents or operate any machinery for 24 hours (because of the anesthesia used during the test).  NUTRITION Drink plenty of fluids.  You may resume your normal diet as instructed by your doctor.  Begin with a light meal and progress to your normal diet. Heavy or fried foods are harder to digest and may make you feel sick to your stomach (nauseated).  Avoid alcoholic beverages for 24 hours or as instructed.  MEDICATIONS You may resume your normal medications unless your doctor tells you otherwise.  WHAT YOU CAN EXPECT TODAY Some feelings of bloating in the abdomen.  Passage of more gas than usual.  Spotting of blood in your stool or on the toilet paper.  IF YOU HAD POLYPS REMOVED DURING THE COLONOSCOPY: No aspirin products for 7 days or as instructed.  No alcohol for 7 days or as instructed.  Eat a soft diet for the next 24 hours.  FINDING OUT THE RESULTS OF YOUR TEST Not all test results are available during your visit. If your test results are not back during the visit, make an appointment with your caregiver to find out the results. Do not assume everything is normal if you have not heard from your  caregiver or the medical facility. It is important for you to follow up on all of your test results.  SEEK IMMEDIATE MEDICAL ATTENTION IF: You have more than a spotting of blood in your stool.  Your belly is swollen (abdominal distention).  You are nauseated or vomiting.  You have a temperature over 101.  You have abdominal pain or discomfort that is severe or gets worse throughout the day.   Your colonoscopy revealed 1 polyp(s) which I removed successfully. Await pathology results, my office will contact you. I recommend repeating colonoscopy in 5 years for surveillance purposes.   You also have diverticulosis and internal hemorrhoids. I would recommend increasing fiber in your diet or adding OTC Benefiber/Metamucil. Be sure to drink at least 4 to 6 glasses of water daily. Follow-up with GI for hemorrhoid banding if bleeding continues.    I hope you have a great rest of your week!  Elon Alas. Abbey Chatters, D.O. Gastroenterology and Hepatology [facility]

## 2022-04-03 NOTE — Transfer of Care (Signed)
Immediate Anesthesia Transfer of Care Note  Patient: Steven Tanner  Procedure(s) Performed: COLONOSCOPY WITH PROPOFOL POLYPECTOMY  Patient Location: Endoscopy Unit  Anesthesia Type:General  Level of Consciousness: awake, alert , and oriented  Airway & Oxygen Therapy: Patient Spontanous Breathing  Post-op Assessment: Report given to RN and Post -op Vital signs reviewed and stable  Post vital signs: Reviewed and stable  Last Vitals:  Vitals Value Taken Time  BP 105/63 04/03/22 1050  Temp 36.6 C 04/03/22 1050  Pulse 89 04/03/22 1050  Resp 13 04/03/22 1050  SpO2 100 % 04/03/22 1050    Last Pain:  Vitals:   04/03/22 1050  TempSrc: Oral  PainSc: 0-No pain      Patients Stated Pain Goal: 8 (27/06/23 7628)  Complications: No notable events documented.

## 2022-04-03 NOTE — H&P (Signed)
Primary Care Physician:  Claretta Fraise, MD Primary Gastroenterologist:  Dr. Abbey Chatters  Pre-Procedure History & Physical: HPI:  Steven Tanner is a 52 y.o. male is here for first ever colonoscopy for colon cancer screening purposes.  Patient denies any family history of colorectal cancer.    Past Medical History:  Diagnosis Date   Abscess    Depression    Diabetes ([place])    GERD (gastroesophageal reflux disease)     Past Surgical History:  Procedure Laterality Date   INCISE AND DRAIN ABCESS     pilonodil cyst-[facility]   PILONIDAL CYST EXCISION N/A 11/02/2013   Procedure: CYST EXCISION PILONIDAL SIMPLE;  Surgeon: Jamesetta So, MD;  Location: [facility];  Service: General;  Laterality: N/A;    Prior to Admission medications   Medication Sig Start Date End Date Taking? Authorizing Provider  aspirin EC 81 MG tablet Take 1 tablet (81 mg total) by mouth daily. 12/11/17  Yes BranchAlphonse Guild, MD  atorvastatin (LIPITOR) 40 MG tablet Take 1 tablet (40 mg total) by mouth daily. 10/20/21  Yes [name], Cletus Gash, MD  lisinopril (ZESTRIL) 10 MG tablet Take 1 tablet (10 mg total) by mouth daily. To protect kidneys from diabetic damage 10/20/21  Yes Claretta Fraise, MD  metFORMIN (GLUCOPHAGE) 1000 MG tablet Take 1 tablet (1,000 mg total) by mouth 2 (two) times daily with a meal. 10/20/21  Yes [name], Cletus Gash, MD  Continuous Blood Gluc Sensor (FREESTYLE LIBRE 3 SENSOR) MISC Place 1 sensor on the skin every 14 days. Use to check glucose continuously 01/22/22   Claretta Fraise, MD  glimepiride (AMARYL) 4 MG tablet Take 1 tablet (4 mg total) by mouth 2 (two) times daily. Patient not taking: Reported on 03/20/2022 04/19/21   Claretta Fraise, MD  ondansetron (ZOFRAN-ODT) 8 MG disintegrating tablet Take 1 tablet (8 mg total) by mouth every 6 (six) hours as needed for nausea or vomiting. Patient not taking: Reported on 02/21/2022 11/27/21   Claretta Fraise, MD  Semaglutide,0.25 or 0.'5MG'$ /DOS, (OZEMPIC, 0.25 OR 0.5 MG/DOSE,) 2 MG/3ML  SOPN Inject 0.25 mg into the skin once a week. 03/21/22   Claretta Fraise, MD  sildenafil (REVATIO) 20 MG tablet Take 2-5 tabs PRN prior to sexual activity 02/03/20   Claretta Fraise, MD    Allergies as of 02/21/2022 - Review Complete 02/21/2022  Allergen Reaction Noted   Codeine Hives, Itching, and Swelling 02/15/2012   Sulfa antibiotics Rash 09/24/2013    Family History  Problem Relation Age of Onset   Hypertension Mother    Diabetes Father    Hypertension Father    Heart disease Father    Colon cancer Neg Hx    Colon polyps Neg Hx     Social History   Socioeconomic History   Marital status: Single    Spouse name: Not on file   Number of children: Not on file   Years of education: Not on file   Highest education level: Not on file  Occupational History   Not on file  Tobacco Use   Smoking status: Former    Packs/day: 1.00    Years: 20.00    Total pack years: 20.00    Types: Cigarettes, Cigars    Start date: 02/15/1995    Quit date: 07/17/2017    Years since quitting: 4.7   Smokeless tobacco: Never  Vaping Use   Vaping Use: Some days  Substance and Sexual Activity   Alcohol use: Yes    Comment: rare   Drug use:  Yes    Types: Marijuana    Comment: weekly- last used 10/29/2013   Sexual activity: Yes    Partners: Female  Other Topics Concern   Not on file  Social History Narrative   Not on file   Social Determinants of Health   Financial Resource Strain: Not on file  Food Insecurity: Not on file  Transportation Needs: Not on file  Physical Activity: Not on file  Stress: Not on file  Social Connections: Not on file  Intimate Partner Violence: Not on file    Review of Systems: See HPI, otherwise negative ROS  Physical Exam: Vital signs in last 24 hours: Temp:  [98.8 F (37.1 C)] 98.8 F (37.1 C) (01/16 0920) Pulse Rate:  [96] 96 (01/16 0920) Resp:  [16] 16 (01/16 0920) BP: (151)/(92) 151/92 (01/16 0920) SpO2:  [99 %] 99 % (01/16 0920) Weight:   [117.9 kg] 117.9 kg (01/16 0920)   General:   Alert,  Well-developed, well-nourished, pleasant and cooperative in NAD Head:  Normocephalic and atraumatic. Eyes:  Sclera clear, no icterus.   Conjunctiva pink. Ears:  Normal auditory acuity. Nose:  No deformity, discharge,  or lesions. Msk:  Symmetrical without gross deformities. Normal posture. Extremities:  Without clubbing or edema. Neurologic:  Alert and  oriented x4;  grossly normal neurologically. Skin:  Intact without significant lesions or rashes. Psych:  Alert and cooperative. Normal mood and affect.  Impression/Plan: Steven Tanner is here for a colonoscopy to be performed for colon cancer screening purposes.  The risks of the procedure including infection, bleed, or perforation as well as benefits, limitations, alternatives and imponderables have been reviewed with the patient. Questions have been answered. All parties agreeable.

## 2022-04-04 LAB — SURGICAL PATHOLOGY

## 2022-04-04 NOTE — Anesthesia Postprocedure Evaluation (Signed)
Anesthesia Post Note  Patient: Steven Tanner  Procedure(s) Performed: COLONOSCOPY WITH PROPOFOL POLYPECTOMY  Patient location during evaluation: Phase II Anesthesia Type: General Level of consciousness: awake Pain management: pain level controlled Vital Signs Assessment: post-procedure vital signs reviewed and stable Respiratory status: spontaneous breathing and respiratory function stable Cardiovascular status: blood pressure returned to baseline and stable Postop Assessment: no headache and no apparent nausea or vomiting Anesthetic complications: no Comments: Late entry   No notable events documented.   Last Vitals:  Vitals:   04/03/22 0920 04/03/22 1050  BP: (!) 151/92 105/63  Pulse: 96 89  Resp: 16 13  Temp: 37.1 C 36.6 C  SpO2: 99% 100%    Last Pain:  Vitals:   04/03/22 1050  TempSrc: Oral  PainSc: 0-No pain                 Louann Sjogren

## 2022-04-09 ENCOUNTER — Encounter (HOSPITAL_COMMUNITY): Payer: Self-pay | Admitting: Internal Medicine

## 2022-04-19 ENCOUNTER — Encounter: Payer: Self-pay | Admitting: Gastroenterology

## 2022-06-04 ENCOUNTER — Other Ambulatory Visit: Payer: Self-pay | Admitting: Family Medicine

## 2022-06-05 ENCOUNTER — Ambulatory Visit (INDEPENDENT_AMBULATORY_CARE_PROVIDER_SITE_OTHER): Payer: BC Managed Care – PPO | Admitting: Family Medicine

## 2022-06-05 ENCOUNTER — Encounter: Payer: Self-pay | Admitting: Family Medicine

## 2022-06-05 VITALS — BP 111/75 | HR 98 | Temp 97.4°F | Ht 70.0 in | Wt 253.4 lb

## 2022-06-05 DIAGNOSIS — E1165 Type 2 diabetes mellitus with hyperglycemia: Secondary | ICD-10-CM

## 2022-06-05 DIAGNOSIS — E782 Mixed hyperlipidemia: Secondary | ICD-10-CM | POA: Diagnosis not present

## 2022-06-05 DIAGNOSIS — R5383 Other fatigue: Secondary | ICD-10-CM | POA: Diagnosis not present

## 2022-06-05 LAB — BAYER DCA HB A1C WAIVED: HB A1C (BAYER DCA - WAIVED): 9.5 % — ABNORMAL HIGH (ref 4.8–5.6)

## 2022-06-05 LAB — LIPID PANEL

## 2022-06-05 MED ORDER — OZEMPIC (1 MG/DOSE) 4 MG/3ML ~~LOC~~ SOPN: 1.0000 mg | PEN_INJECTOR | SUBCUTANEOUS | 5 refills | Status: DC

## 2022-06-05 NOTE — Progress Notes (Signed)
Subjective:  Patient ID: Steven Tanner, male    DOB: November 26, 1970  Age: 52 y.o. MRN: [id number]  CC: Medical Management of Chronic Issues   HPI Steven Tanner presents forFollow-up of diabetes. Patient checks blood sugar at home.   150-180 random. Off Libre due to expense Patient denies symptoms such as polyuria, polydipsia, excessive hunger, nausea No significant hypoglycemic spells noted. Medications reviewed. Pt reports taking them regularlyexcept Ozempic was too expensive for 2-3 weeks in January when Steven Tanner was out of work. Taking it 0.5 mg weekly for 5 weeks.    History Steven Tanner has a past medical history of Abscess, Depression, Diabetes ([place]), and GERD (gastroesophageal reflux disease).   Steven Tanner has a past surgical history that includes Incise and drain abcess; Pilonidal cyst excision (N/A, 11/02/2013); Colonoscopy with propofol (N/A, 04/03/2022); and polypectomy (04/03/2022).   His family history includes Diabetes in his father; Heart disease in his father; Hypertension in his father and mother.Steven Tanner reports that Steven Tanner quit smoking about 4 years ago. His smoking use included cigarettes and cigars. Steven Tanner started smoking about 27 years ago. Steven Tanner has a 20.00 pack-year smoking history. Steven Tanner has never used smokeless tobacco. Steven Tanner reports current alcohol use. Steven Tanner reports current drug use. Drug: Marijuana.  Current Outpatient Medications on File Prior to Visit  Medication Sig Dispense Refill   aspirin EC 81 MG tablet Take 1 tablet (81 mg total) by mouth daily. 90 tablet 3   atorvastatin (LIPITOR) 40 MG tablet Take 1 tablet (40 mg total) by mouth daily. 90 tablet 3   Continuous Blood Gluc Sensor (FREESTYLE LIBRE 3 SENSOR) MISC Place 1 sensor on the skin every 14 days. Use to check glucose continuously 2 each 11   lisinopril (ZESTRIL) 10 MG tablet Take 1 tablet (10 mg total) by mouth daily. To protect kidneys from diabetic damage 90 tablet 3   metFORMIN (GLUCOPHAGE) 1000 MG tablet Take 1 tablet (1,000 mg total) by  mouth 2 (two) times daily with a meal. 180 tablet 3   Semaglutide,0.25 or 0.5MG /DOS, (OZEMPIC, 0.25 OR 0.5 MG/DOSE,) 2 MG/3ML SOPN INJECT 0.25MG  INTO THE SKIN ONE TIME PER WEEK 2 mL 1   sildenafil (REVATIO) 20 MG tablet Take 2-5 tabs PRN prior to sexual activity 50 tablet 5   No current facility-administered medications on file prior to visit.    ROS Review of Systems  Constitutional:  Positive for fatigue. Negative for fever.  Respiratory:  Negative for shortness of breath.   Cardiovascular:  Negative for chest pain.  Musculoskeletal:  Negative for arthralgias.  Skin:  Negative for rash.    Objective:  BP 111/75   Pulse 98   Temp (!) 97.4 F (36.3 C)   Ht 5\' 10"  (1.778 m)   Wt 253 lb 6.4 oz (114.9 kg)   SpO2 98%   BMI 36.36 kg/m   BP Readings from Last 3 Encounters:  06/05/22 111/75  04/03/22 105/63  02/21/22 118/79    Wt Readings from Last 3 Encounters:  06/05/22 253 lb 6.4 oz (114.9 kg)  04/03/22 260 lb (117.9 kg)  02/21/22 264 lb (119.7 kg)     Physical Exam Vitals reviewed.  Constitutional:      Appearance: Steven Tanner is well-developed.  HENT:     Head: Normocephalic and atraumatic.     Right Ear: External ear normal.     Left Ear: External ear normal.     Mouth/Throat:     Pharynx: No oropharyngeal exudate or posterior oropharyngeal erythema.  Eyes:  Pupils: Pupils are equal, round, and reactive to light.  Cardiovascular:     Rate and Rhythm: Normal rate and regular rhythm.     Heart sounds: No murmur heard. Pulmonary:     Effort: No respiratory distress.     Breath sounds: Normal breath sounds.  Musculoskeletal:     Cervical back: Normal range of motion and neck supple.  Neurological:     Mental Status: Steven Tanner is alert and oriented to person, place, and time.       Assessment & Plan:   Steven Tanner was seen today for medical management of chronic issues.  Diagnoses and all orders for this visit:  Uncontrolled type 2 diabetes mellitus with hyperglycemia  ([place]) -     Bayer DCA Hb A1c Waived -     CMP14+EGFR -     CBC with Differential/Platelet  Mixed hyperlipidemia -     Lipid panel      I have discontinued Roselyn Bering. [place]'s glimepiride and ondansetron. I am also having him maintain his aspirin EC, sildenafil, atorvastatin, lisinopril, metFORMIN, FreeStyle Libre 3 Sensor, and Ozempic (0.25 or 0.5 MG/DOSE).  No orders of the defined types were placed in this encounter.    Follow-up: Return in about 3 months (around 09/05/2022).  Claretta Fraise, M.D.

## 2022-06-05 NOTE — Addendum Note (Signed)
Addended by: Claretta Fraise on: 06/05/2022 08:37 AM   Modules accepted: Orders

## 2022-06-06 LAB — CBC WITH DIFFERENTIAL/PLATELET
Basophils Absolute: 0.1 10*3/uL (ref 0.0–0.2)
Basos: 1 %
EOS (ABSOLUTE): 0.3 10*3/uL (ref 0.0–0.4)
Eos: 4 %
Hematocrit: 42.3 % (ref 37.5–51.0)
Hemoglobin: 14 g/dL (ref 13.0–17.7)
Immature Grans (Abs): 0 10*3/uL (ref 0.0–0.1)
Immature Granulocytes: 0 %
Lymphocytes Absolute: 3.3 10*3/uL — ABNORMAL HIGH (ref 0.7–3.1)
Lymphs: 44 %
MCH: 27.7 pg (ref 26.6–33.0)
MCHC: 33.1 g/dL (ref 31.5–35.7)
MCV: 84 fL (ref 79–97)
Monocytes Absolute: 0.7 10*3/uL (ref 0.1–0.9)
Monocytes: 9 %
Neutrophils Absolute: 3 10*3/uL (ref 1.4–7.0)
Neutrophils: 42 %
Platelets: 349 10*3/uL (ref 150–450)
RBC: 5.06 x10E6/uL (ref 4.14–5.80)
RDW: 13.2 % (ref 11.6–15.4)
WBC: 7.3 10*3/uL (ref 3.4–10.8)

## 2022-06-06 LAB — CMP14+EGFR
ALT: 36 IU/L (ref 0–44)
AST: 24 IU/L (ref 0–40)
Albumin/Globulin Ratio: 1.8 (ref 1.2–2.2)
Albumin: 4.4 g/dL (ref 4.1–5.1)
Alkaline Phosphatase: 85 IU/L (ref 44–121)
BUN/Creatinine Ratio: 11 (ref 9–20)
BUN: 12 mg/dL (ref 6–24)
Bilirubin Total: 0.4 mg/dL (ref 0.0–1.2)
CO2: 18 mmol/L — ABNORMAL LOW (ref 20–29)
Calcium: 9.5 mg/dL (ref 8.7–10.2)
Chloride: 105 mmol/L (ref 96–106)
Creatinine, Ser: 1.14 mg/dL (ref 0.76–1.27)
Globulin, Total: 2.5 g/dL (ref 1.5–4.5)
Glucose: 272 mg/dL — ABNORMAL HIGH (ref 70–99)
Potassium: 4.3 mmol/L (ref 3.5–5.2)
Sodium: 139 mmol/L (ref 134–144)
Total Protein: 6.9 g/dL (ref 6.0–8.5)
eGFR: 79 mL/min/{1.73_m2} (ref >59)

## 2022-06-06 LAB — LIPID PANEL
Chol/HDL Ratio: 4.7 ratio (ref 0.0–5.0)
Cholesterol, Total: 135 mg/dL (ref 100–199)
HDL: 29 mg/dL — ABNORMAL LOW (ref >39)
LDL Chol Calc (NIH): 88 mg/dL (ref 0–99)
Triglycerides: 92 mg/dL (ref 0–149)
VLDL Cholesterol Cal: 18 mg/dL (ref 5–40)

## 2022-06-06 LAB — MICROALBUMIN / CREATININE URINE RATIO
Creatinine, Urine: 390.7 mg/dL
Microalb/Creat Ratio: 13 mg/g creat (ref 0–29)
Microalbumin, Urine: 51.6 ug/mL

## 2022-06-06 NOTE — Progress Notes (Signed)
Hello Olga,  Your lab result is normal and/or stable.Some minor variations that are not significant are commonly marked abnormal, but do not represent any medical problem for you.  Best regards, Doak Mah, M.D.

## 2022-06-06 NOTE — Progress Notes (Signed)
Hello Amado,  Your lab result is normal and/or stable.Some minor variations that are not significant are commonly marked abnormal, but do not represent any medical problem for you.  Best regards, Kamber Vignola, M.D.

## 2022-06-07 LAB — TESTOSTERONE: Testosterone: 483 ng/dL (ref 264–916)

## 2022-06-07 LAB — TSH+FREE T4
Free T4: 1.39 ng/dL (ref 0.82–1.77)
TSH: 1.87 u[IU]/mL (ref 0.450–4.500)

## 2022-06-07 LAB — SPECIMEN STATUS REPORT

## 2022-09-05 ENCOUNTER — Encounter: Payer: Self-pay | Admitting: Family Medicine

## 2022-09-05 ENCOUNTER — Ambulatory Visit (INDEPENDENT_AMBULATORY_CARE_PROVIDER_SITE_OTHER): Payer: BC Managed Care – PPO | Admitting: Family Medicine

## 2022-09-05 VITALS — BP 116/70 | HR 92 | Temp 97.5°F | Ht 70.0 in | Wt 255.6 lb

## 2022-09-05 DIAGNOSIS — E1165 Type 2 diabetes mellitus with hyperglycemia: Secondary | ICD-10-CM

## 2022-09-05 DIAGNOSIS — E782 Mixed hyperlipidemia: Secondary | ICD-10-CM | POA: Diagnosis not present

## 2022-09-05 DIAGNOSIS — Z7984 Long term (current) use of oral hypoglycemic drugs: Secondary | ICD-10-CM

## 2022-09-05 DIAGNOSIS — N5203 Combined arterial insufficiency and corporo-venous occlusive erectile dysfunction: Secondary | ICD-10-CM | POA: Diagnosis not present

## 2022-09-05 DIAGNOSIS — R710 Precipitous drop in hematocrit: Secondary | ICD-10-CM | POA: Diagnosis not present

## 2022-09-05 DIAGNOSIS — Z7985 Long-term (current) use of injectable non-insulin antidiabetic drugs: Secondary | ICD-10-CM

## 2022-09-05 LAB — BAYER DCA HB A1C WAIVED: HB A1C (BAYER DCA - WAIVED): 10.2 % — ABNORMAL HIGH (ref 4.8–5.6)

## 2022-09-05 LAB — LIPID PANEL

## 2022-09-05 MED ORDER — METFORMIN HCL 1000 MG PO TABS: 1000.0000 mg | ORAL_TABLET | Freq: Two times a day (BID) | ORAL | 3 refills | Status: DC

## 2022-09-05 MED ORDER — LISINOPRIL 10 MG PO TABS: 10.0000 mg | ORAL_TABLET | Freq: Every day | ORAL | 3 refills | Status: DC

## 2022-09-05 MED ORDER — ATORVASTATIN CALCIUM 40 MG PO TABS: 40.0000 mg | ORAL_TABLET | Freq: Every day | ORAL | 3 refills | Status: DC

## 2022-09-05 MED ORDER — SILDENAFIL CITRATE 20 MG PO TABS: ORAL_TABLET | ORAL | 5 refills | Status: DC

## 2022-09-05 MED ORDER — METFORMIN HCL 1000 MG PO TABS
1000.0000 mg | ORAL_TABLET | Freq: Two times a day (BID) | ORAL | 3 refills | Status: DC
Start: 2022-09-05 — End: 2023-04-29

## 2022-09-05 MED ORDER — SEMAGLUTIDE (2 MG/DOSE) 8 MG/3ML ~~LOC~~ SOPN: 2.0000 mg | PEN_INJECTOR | SUBCUTANEOUS | 2 refills | Status: DC

## 2022-09-05 NOTE — Progress Notes (Signed)
Subjective:  Patient ID: Steven Tanner,  male    DOB: 07-Oct-1970  Age: 52 y.o.    CC: Medical Management of Chronic Issues   HPI Steven Tanner presents for  follow-up of hypertension. Patient has no history of headache chest pain or shortness of breath or recent cough. Patient also denies symptoms of TIA such as numbness weakness lateralizing. Patient denies side effects from medication. States taking it regularly.  Patient also  in for follow-up of elevated cholesterol. Doing well without complaints on current medication. Denies side effects  including myalgia and arthralgia and nausea. Also in today for liver function testing. Currently no chest pain, shortness of breath or other cardiovascular related symptoms noted.  Follow-up of diabetes. Patient does check blood sugar at home. Readings run pretty good.  Patient denies symptoms such as excessive hunger or urinary frequency, excessive hunger, nausea No significant hypoglycemic spells noted. Medications reviewed. Pt reports taking them regularly. He went back to [facility] when he ran out of metformin. Still taking it with the Ozemmpic   History Nasheem has a past medical history of Abscess, Depression, Diabetes (HCC), and GERD (gastroesophageal reflux disease).   He has a past surgical history that includes Incise and drain abcess; Pilonidal cyst excision (N/A, 11/02/2013); Colonoscopy with propofol (N/A, 04/03/2022); and polypectomy (04/03/2022).   His family history includes Diabetes in his father; Heart disease in his father; Hypertension in his father and mother.He reports that he quit smoking about 5 years ago. His smoking use included cigarettes and cigars. He started smoking about 27 years ago. He has a 20.00 pack-year smoking history. He has never used smokeless tobacco. He reports current alcohol use. He reports current drug use. Drug: Marijuana.  Current Outpatient Medications on File Prior to Visit  Medication Sig Dispense  Refill   aspirin EC 81 MG tablet Take 1 tablet (81 mg total) by mouth daily. 90 tablet 3   Continuous Blood Gluc Sensor (FREESTYLE LIBRE 3 SENSOR) MISC Place 1 sensor on the skin every 14 days. Use to check glucose continuously 2 each 11   No current facility-administered medications on file prior to visit.    ROS Review of Systems  Constitutional:  Negative for fever.  Respiratory:  Negative for shortness of breath.   Cardiovascular:  Positive for chest pain (SS heartburn from taking sildenafil last night.).  Musculoskeletal:  Negative for arthralgias.  Skin:  Negative for rash.    Objective:  BP 116/70   Pulse 92   Temp (!) 97.5 F (36.4 C)   Ht 5\' 10"  (1.778 m)   Wt 255 lb 9.6 oz (115.9 kg)   SpO2 98%   BMI 36.67 kg/m   BP Readings from Last 3 Encounters:  09/05/22 116/70  06/05/22 111/75  04/03/22 105/63    Wt Readings from Last 3 Encounters:  09/05/22 255 lb 9.6 oz (115.9 kg)  06/05/22 253 lb 6.4 oz (114.9 kg)  04/03/22 260 lb (117.9 kg)     Physical Exam Vitals reviewed.  Constitutional:      Appearance: He is well-developed.  HENT:     Head: Normocephalic and atraumatic.     Right Ear: External ear normal.     Left Ear: External ear normal.     Mouth/Throat:     Pharynx: No oropharyngeal exudate or posterior oropharyngeal erythema.  Eyes:     Pupils: Pupils are equal, round, and reactive to light.  Cardiovascular:     Rate and Rhythm: Normal rate and  regular rhythm.     Heart sounds: No murmur heard. Pulmonary:     Effort: No respiratory distress.     Breath sounds: Normal breath sounds.  Musculoskeletal:     Cervical back: Normal range of motion and neck supple.  Neurological:     Mental Status: He is alert and oriented to person, place, and time.     Diabetic Foot Exam - Simple   No data filed     Lab Results  Component Value Date   HGBA1C 10.2 (H) 09/05/2022   HGBA1C 9.5 (H) 06/05/2022   HGBA1C 9.0 (H) 01/22/2022    Assessment &  Plan:   Steven Tanner was seen today for medical management of chronic issues.  Diagnoses and all orders for this visit:  Uncontrolled type 2 diabetes mellitus with hyperglycemia (HCC) -     CBC with Differential/Platelet -     CMP14+EGFR -     Bayer DCA Hb A1c Waived -     Discontinue: metFORMIN (GLUCOPHAGE) 1000 MG tablet; Take 1 tablet (1,000 mg total) by mouth 2 (two) times daily with a meal. -     metFORMIN (GLUCOPHAGE) 1000 MG tablet; Take 1 tablet (1,000 mg total) by mouth 2 (two) times daily with a meal.  Mixed hyperlipidemia -     Lipid panel -     atorvastatin (LIPITOR) 40 MG tablet; Take 1 tablet (40 mg total) by mouth daily.  Combined arterial insufficiency and corporo-venous occlusive erectile dysfunction -     sildenafil (REVATIO) 20 MG tablet; Take 2-5 tabs PRN prior to sexual activity  Other orders -     lisinopril (ZESTRIL) 10 MG tablet; Take 1 tablet (10 mg total) by mouth daily. To protect kidneys from diabetic damage -     Semaglutide, 2 MG/DOSE, 8 MG/3ML SOPN; Inject 2 mg as directed once a week.   I have discontinued Steven Tanner. Steven Tanner's Ozempic (1 MG/DOSE). I am also having him start on Semaglutide (2 MG/DOSE). Additionally, I am having him maintain his aspirin EC, FreeStyle Libre 3 Sensor, atorvastatin, lisinopril, metFORMIN, and sildenafil.  Meds ordered this encounter  Medications   atorvastatin (LIPITOR) 40 MG tablet    Sig: Take 1 tablet (40 mg total) by mouth daily.    Dispense:  90 tablet    Refill:  3   lisinopril (ZESTRIL) 10 MG tablet    Sig: Take 1 tablet (10 mg total) by mouth daily. To protect kidneys from diabetic damage    Dispense:  90 tablet    Refill:  3   DISCONTD: metFORMIN (GLUCOPHAGE) 1000 MG tablet    Sig: Take 1 tablet (1,000 mg total) by mouth 2 (two) times daily with a meal.    Dispense:  180 tablet    Refill:  3   metFORMIN (GLUCOPHAGE) 1000 MG tablet    Sig: Take 1 tablet (1,000 mg total) by mouth 2 (two) times daily with a meal.     Dispense:  180 tablet    Refill:  3   sildenafil (REVATIO) 20 MG tablet    Sig: Take 2-5 tabs PRN prior to sexual activity    Dispense:  50 tablet    Refill:  5   Semaglutide, 2 MG/DOSE, 8 MG/3ML SOPN    Sig: Inject 2 mg as directed once a week.    Dispense:  3 mL    Refill:  2     Follow-up: Return in about 3 months (around 12/06/2022).  Mechele Claude, M.D.

## 2022-09-06 LAB — CMP14+EGFR
ALT: 22 IU/L (ref 0–44)
AST: 10 IU/L (ref 0–40)
Albumin: 4.1 g/dL (ref 4.1–5.1)
Alkaline Phosphatase: 78 IU/L (ref 44–121)
BUN/Creatinine Ratio: 12 (ref 9–20)
BUN: 14 mg/dL (ref 6–24)
Bilirubin Total: 0.3 mg/dL (ref 0.0–1.2)
CO2: 20 mmol/L (ref 20–29)
Calcium: 9 mg/dL (ref 8.7–10.2)
Chloride: 105 mmol/L (ref 96–106)
Creatinine, Ser: 1.15 mg/dL (ref 0.76–1.27)
Globulin, Total: 2.4 g/dL (ref 1.5–4.5)
Glucose: 276 mg/dL — ABNORMAL HIGH (ref 70–99)
Potassium: 4.6 mmol/L (ref 3.5–5.2)
Sodium: 136 mmol/L (ref 134–144)
Total Protein: 6.5 g/dL (ref 6.0–8.5)
eGFR: 78 mL/min/{1.73_m2} (ref >59)

## 2022-09-06 LAB — CBC WITH DIFFERENTIAL/PLATELET
Basophils Absolute: 0.1 10*3/uL (ref 0.0–0.2)
Basos: 1 %
EOS (ABSOLUTE): 0.2 10*3/uL (ref 0.0–0.4)
Eos: 3 %
Hematocrit: 41.5 % (ref 37.5–51.0)
Hemoglobin: 12.8 g/dL — ABNORMAL LOW (ref 13.0–17.7)
Immature Grans (Abs): 0 10*3/uL (ref 0.0–0.1)
Immature Granulocytes: 0 %
Lymphocytes Absolute: 2.4 10*3/uL (ref 0.7–3.1)
Lymphs: 36 %
MCH: 26.1 pg — ABNORMAL LOW (ref 26.6–33.0)
MCHC: 30.8 g/dL — ABNORMAL LOW (ref 31.5–35.7)
MCV: 85 fL (ref 79–97)
Monocytes Absolute: 0.7 10*3/uL (ref 0.1–0.9)
Monocytes: 10 %
Neutrophils Absolute: 3.3 10*3/uL (ref 1.4–7.0)
Neutrophils: 50 %
Platelets: 300 10*3/uL (ref 150–450)
RBC: 4.91 x10E6/uL (ref 4.14–5.80)
RDW: 12.9 % (ref 11.6–15.4)
WBC: 6.6 10*3/uL (ref 3.4–10.8)

## 2022-09-06 LAB — LIPID PANEL
Chol/HDL Ratio: 4.4 ratio (ref 0.0–5.0)
Cholesterol, Total: 128 mg/dL (ref 100–199)
LDL Chol Calc (NIH): 85 mg/dL (ref 0–99)
Triglycerides: 69 mg/dL (ref 0–149)
VLDL Cholesterol Cal: 14 mg/dL (ref 5–40)

## 2022-09-07 LAB — IRON AND TIBC

## 2022-09-08 LAB — IRON AND TIBC
Iron Saturation: 18 % (ref 15–55)
Iron: 54 ug/dL (ref 38–169)
UIBC: 245 ug/dL (ref 111–343)

## 2022-09-08 LAB — FERRITIN: Ferritin: 163 ng/mL (ref 30–400)

## 2022-09-08 LAB — SPECIMEN STATUS REPORT

## 2022-09-10 NOTE — Progress Notes (Signed)
Hello Zaahir,  Your lab result is normal and/or stable.Some minor variations that are not significant are commonly marked abnormal, but do not represent any medical problem for you.  Best regards, Cline Draheim, M.D.

## 2022-10-31 ENCOUNTER — Other Ambulatory Visit: Payer: Self-pay | Admitting: Family Medicine

## 2022-12-06 ENCOUNTER — Ambulatory Visit: Payer: BC Managed Care – PPO | Admitting: Family Medicine

## 2022-12-13 ENCOUNTER — Other Ambulatory Visit (HOSPITAL_COMMUNITY): Payer: Self-pay

## 2022-12-13 ENCOUNTER — Telehealth: Payer: Self-pay

## 2022-12-13 NOTE — Telephone Encounter (Signed)
Steven Tanner (Key: [id number]) Ozempic (0.25 or 0.5 MG/DOSE) 2MG /3ML pen-injectors Form [employer] [place] [employer] Form Created 7 days ago Sent to Plan 23 minutes ago Determination Message from [employer] Not Found

## 2022-12-13 NOTE — Telephone Encounter (Signed)
Eligibility check did not pull any insurance in [facility] [place]. Called [facility], they did not have any active insurance for patient. Please advise.

## 2022-12-17 NOTE — Telephone Encounter (Signed)
CANCEL REQUEST. PATIENT STATES HE IS CURRENTLY SELF PAY, NO COVERAGE AND CANNOT AFFORD AT THIS TIME

## 2022-12-20 ENCOUNTER — Encounter: Payer: Self-pay | Admitting: Family Medicine

## 2022-12-20 ENCOUNTER — Ambulatory Visit (INDEPENDENT_AMBULATORY_CARE_PROVIDER_SITE_OTHER): Payer: Self-pay | Admitting: Family Medicine

## 2022-12-20 VITALS — BP 118/65 | HR 95 | Temp 97.4°F | Ht 70.0 in | Wt 250.4 lb

## 2022-12-20 DIAGNOSIS — E782 Mixed hyperlipidemia: Secondary | ICD-10-CM

## 2022-12-20 DIAGNOSIS — E1165 Type 2 diabetes mellitus with hyperglycemia: Secondary | ICD-10-CM

## 2022-12-20 DIAGNOSIS — Z7984 Long term (current) use of oral hypoglycemic drugs: Secondary | ICD-10-CM

## 2022-12-20 LAB — BAYER DCA HB A1C WAIVED: HB A1C (BAYER DCA - WAIVED): 8.8 % — ABNORMAL HIGH (ref 4.8–5.6)

## 2022-12-20 MED ORDER — GLIMEPIRIDE 4 MG PO TABS: 4.0000 mg | ORAL_TABLET | Freq: Every day | ORAL | 1 refills | Status: DC

## 2022-12-20 NOTE — Progress Notes (Signed)
Subjective:  Patient ID: Steven Tanner, male    DOB: 1970/08/20  Age: 52 y.o. MRN: [id number]  CC: Medical Management of Chronic Issues   HPI MAEJOR ERVEN presents forFollow-up of diabetes. Patient not checking blood sugar at home.   Patient denies symptoms such as polyuria, polydipsia, excessive hunger, nausea No significant hypoglycemic spells noted. Medications reviewed. No insurnce off Ozempic 2 mos. Still taking metformin   in for follow-up of elevated cholesterol. Doing well without complaints on current medication. Denies side effects of statin including myalgia and arthralgia and nausea. Currently no chest pain, shortness of breath or other cardiovascular related symptoms noted.    History Cloud has a past medical history of Abscess, Depression, Diabetes (HCC), and GERD (gastroesophageal reflux disease).   He has a past surgical history that includes Incise and drain abcess; Pilonidal cyst excision (N/A, 11/02/2013); Colonoscopy with propofol (N/A, 04/03/2022); and polypectomy (04/03/2022).   His family history includes Diabetes in his father; Heart disease in his father; Hypertension in his father and mother.He reports that he quit smoking about 5 years ago. His smoking use included cigarettes and cigars. He started smoking about 27 years ago. He has a 22.4 pack-year smoking history. He has never used smokeless tobacco. He reports current alcohol use. He reports current drug use. Drug: Marijuana.  Current Outpatient Medications on File Prior to Visit  Medication Sig Dispense Refill   aspirin EC 81 MG tablet Take 1 tablet (81 mg total) by mouth daily. 90 tablet 3   Continuous Blood Gluc Sensor (FREESTYLE LIBRE 3 SENSOR) MISC Place 1 sensor on the skin every 14 days. Use to check glucose continuously 2 each 11   lisinopril (ZESTRIL) 10 MG tablet Take 1 tablet (10 mg total) by mouth daily. To protect kidneys from diabetic damage 90 tablet 3   metFORMIN (GLUCOPHAGE) 1000 MG  tablet Take 1 tablet (1,000 mg total) by mouth 2 (two) times daily with a meal. 180 tablet 3   sildenafil (REVATIO) 20 MG tablet Take 2-5 tabs PRN prior to sexual activity 50 tablet 5   atorvastatin (LIPITOR) 40 MG tablet Take 1 tablet (40 mg total) by mouth daily. (Patient not taking: Reported on 12/20/2022) 90 tablet 3   No current facility-administered medications on file prior to visit.    ROS Review of Systems  Constitutional:  Negative for fever.  Respiratory:  Negative for shortness of breath.   Cardiovascular:  Negative for chest pain.  Musculoskeletal:  Negative for arthralgias.  Skin:  Negative for rash.    Objective:  BP 118/65   Pulse 95   Temp (!) 97.4 F (36.3 C)   Ht 5\' 10"  (1.778 m)   Wt 250 lb 6.4 oz (113.6 kg)   SpO2 98%   BMI 35.93 kg/m   BP Readings from Last 3 Encounters:  12/20/22 118/65  09/05/22 116/70  06/05/22 111/75    Wt Readings from Last 3 Encounters:  12/20/22 250 lb 6.4 oz (113.6 kg)  09/05/22 255 lb 9.6 oz (115.9 kg)  06/05/22 253 lb 6.4 oz (114.9 kg)     Physical Exam Vitals reviewed.  Constitutional:      Appearance: He is well-developed.  HENT:     Head: Normocephalic and atraumatic.     Right Ear: External ear normal.     Left Ear: External ear normal.     Mouth/Throat:     Pharynx: No oropharyngeal exudate or posterior oropharyngeal erythema.  Eyes:     Pupils: Pupils  are equal, round, and reactive to light.  Cardiovascular:     Rate and Rhythm: Normal rate and regular rhythm.     Heart sounds: No murmur heard. Pulmonary:     Effort: No respiratory distress.     Breath sounds: Normal breath sounds.  Musculoskeletal:     Cervical back: Normal range of motion and neck supple.  Neurological:     Mental Status: He is alert and oriented to person, place, and time.       Assessment & Plan:   Cavin was seen today for medical management of chronic issues.  Diagnoses and all orders for this visit:  Uncontrolled type  2 diabetes mellitus with hyperglycemia (HCC) -     Bayer DCA Hb A1c Waived -     CBC with Differential/Platelet -     CMP14+EGFR  Mixed hyperlipidemia -     Lipid panel  Other orders -     glimepiride (AMARYL) 4 MG tablet; Take 1 tablet (4 mg total) by mouth daily before breakfast.    Pt. Has been noncompliant due to expense caused by loss of insurance. He could not afford the $650 premium.   I have discontinued Donnetta Simpers Semaglutide (2 MG/DOSE). I am also having him start on glimepiride. Additionally, I am having him maintain his aspirin EC, FreeStyle Libre 3 Sensor, atorvastatin, lisinopril, metFORMIN, and sildenafil.  Meds ordered this encounter  Medications   glimepiride (AMARYL) 4 MG tablet    Sig: Take 1 tablet (4 mg total) by mouth daily before breakfast.    Dispense:  90 tablet    Refill:  1     Follow-up: Return in about 3 months (around 03/22/2023).  Mechele Claude, M.D.

## 2022-12-21 LAB — CMP14+EGFR
ALT: 30 [IU]/L (ref 0–44)
AST: 19 [IU]/L (ref 0–40)
Albumin: 4.4 g/dL (ref 4.1–5.1)
Alkaline Phosphatase: 70 [IU]/L (ref 44–121)
BUN/Creatinine Ratio: 15 (ref 9–20)
BUN: 17 mg/dL (ref 6–24)
Bilirubin Total: 0.4 mg/dL (ref 0.0–1.2)
CO2: 20 mmol/L (ref 20–29)
Calcium: 9.4 mg/dL (ref 8.7–10.2)
Chloride: 101 mmol/L (ref 96–106)
Creatinine, Ser: 1.11 mg/dL (ref 0.76–1.27)
Globulin, Total: 2.3 g/dL (ref 1.5–4.5)
Glucose: 262 mg/dL — ABNORMAL HIGH (ref 70–99)
Potassium: 4.6 mmol/L (ref 3.5–5.2)
Sodium: 136 mmol/L (ref 134–144)
Total Protein: 6.7 g/dL (ref 6.0–8.5)
eGFR: 81 mL/min/{1.73_m2} (ref >59)

## 2022-12-21 LAB — CBC WITH DIFFERENTIAL/PLATELET
Basophils Absolute: 0 10*3/uL (ref 0.0–0.2)
Basos: 1 %
EOS (ABSOLUTE): 0.2 10*3/uL (ref 0.0–0.4)
Eos: 3 %
Hematocrit: 42.1 % (ref 37.5–51.0)
Hemoglobin: 13.6 g/dL (ref 13.0–17.7)
Immature Grans (Abs): 0 10*3/uL (ref 0.0–0.1)
Immature Granulocytes: 0 %
Lymphocytes Absolute: 3.3 10*3/uL — ABNORMAL HIGH (ref 0.7–3.1)
Lymphs: 45 %
MCH: 27.6 pg (ref 26.6–33.0)
MCHC: 32.3 g/dL (ref 31.5–35.7)
MCV: 86 fL (ref 79–97)
Monocytes Absolute: 0.8 10*3/uL (ref 0.1–0.9)
Monocytes: 10 %
Neutrophils Absolute: 3 10*3/uL (ref 1.4–7.0)
Neutrophils: 41 %
Platelets: 294 10*3/uL (ref 150–450)
RBC: 4.92 x10E6/uL (ref 4.14–5.80)
RDW: 13.7 % (ref 11.6–15.4)
WBC: 7.3 10*3/uL (ref 3.4–10.8)

## 2022-12-21 LAB — LIPID PANEL
Chol/HDL Ratio: 7.3 {ratio} — ABNORMAL HIGH (ref 0.0–5.0)
Cholesterol, Total: 218 mg/dL — ABNORMAL HIGH (ref 100–199)
HDL: 30 mg/dL — ABNORMAL LOW (ref >39)
LDL Chol Calc (NIH): 153 mg/dL — ABNORMAL HIGH (ref 0–99)
Triglycerides: 188 mg/dL — ABNORMAL HIGH (ref 0–149)
VLDL Cholesterol Cal: 35 mg/dL (ref 5–40)

## 2023-01-21 ENCOUNTER — Telehealth: Payer: Self-pay | Admitting: Family Medicine

## 2023-01-31 ENCOUNTER — Ambulatory Visit (INDEPENDENT_AMBULATORY_CARE_PROVIDER_SITE_OTHER): Payer: Self-pay | Admitting: *Deleted

## 2023-01-31 DIAGNOSIS — E1165 Type 2 diabetes mellitus with hyperglycemia: Secondary | ICD-10-CM

## 2023-01-31 LAB — HM DIABETES EYE EXAM

## 2023-01-31 NOTE — Progress Notes (Signed)
Steven Tanner arrived 01/31/2023 and has given verbal consent to obtain images and complete their overdue diabetic retinal screening.  The images have been sent to an ophthalmologist or optometrist for review and interpretation.  Results will be sent back to Mechele Claude, MD for review.  Patient has been informed they will be contacted when we receive the results via telephone or MyChart

## 2023-03-04 ENCOUNTER — Emergency Department (HOSPITAL_COMMUNITY): Payer: Self-pay

## 2023-03-04 ENCOUNTER — Encounter (HOSPITAL_COMMUNITY): Payer: Self-pay

## 2023-03-04 ENCOUNTER — Emergency Department (HOSPITAL_COMMUNITY)
Admission: EM | Admit: 2023-03-04 | Discharge: 2023-03-04 | Disposition: A | Discharge status: Home / Self Care | Payer: Self-pay | Attending: Emergency Medicine | Admitting: Emergency Medicine

## 2023-03-04 DIAGNOSIS — M5412 Radiculopathy, cervical region: Secondary | ICD-10-CM

## 2023-03-04 DIAGNOSIS — Z7984 Long term (current) use of oral hypoglycemic drugs: Secondary | ICD-10-CM | POA: Insufficient documentation

## 2023-03-04 DIAGNOSIS — Z79899 Other long term (current) drug therapy: Secondary | ICD-10-CM | POA: Insufficient documentation

## 2023-03-04 DIAGNOSIS — Z7982 Long term (current) use of aspirin: Secondary | ICD-10-CM | POA: Insufficient documentation

## 2023-03-04 LAB — CBC WITH DIFFERENTIAL/PLATELET
Abs Immature Granulocytes: 0.01 10*3/uL (ref 0.00–0.07)
Basophils Absolute: 0 10*3/uL (ref 0.0–0.1)
Basophils Relative: 1 %
Eosinophils Absolute: 0.2 10*3/uL (ref 0.0–0.5)
Eosinophils Relative: 4 %
HCT: 42.2 % (ref 39.0–52.0)
Hemoglobin: 13.6 g/dL (ref 13.0–17.0)
Immature Granulocytes: 0 %
Lymphocytes Relative: 41 %
Lymphs Abs: 2 10*3/uL (ref 0.7–4.0)
MCH: 26.7 pg (ref 26.0–34.0)
MCHC: 32.2 g/dL (ref 30.0–36.0)
MCV: 82.9 fL (ref 80.0–100.0)
Monocytes Absolute: 0.6 10*3/uL (ref 0.1–1.0)
Monocytes Relative: 12 %
Neutro Abs: 2 10*3/uL (ref 1.7–7.7)
Neutrophils Relative %: 42 %
Platelets: 259 10*3/uL (ref 150–400)
RBC: 5.09 MIL/uL (ref 4.22–5.81)
RDW: 12.9 % (ref 11.5–15.5)
WBC: 4.8 10*3/uL (ref 4.0–10.5)
nRBC: 0 % (ref 0.0–0.2)

## 2023-03-04 LAB — BASIC METABOLIC PANEL
Anion gap: 6 (ref 5–15)
BUN: 11 mg/dL (ref 6–20)
CO2: 23 mmol/L (ref 22–32)
Calcium: 8.9 mg/dL (ref 8.9–10.3)
Chloride: 104 mmol/L (ref 98–111)
Creatinine, Ser: 1.05 mg/dL (ref 0.61–1.24)
GFR, Estimated: >60 mL/min (ref >60)
Glucose, Bld: 345 mg/dL — ABNORMAL HIGH (ref 70–99)
Potassium: 4.7 mmol/L (ref 3.5–5.1)
Sodium: 133 mmol/L — ABNORMAL LOW (ref 135–145)

## 2023-03-04 MED ORDER — HYDROCODONE-ACETAMINOPHEN 5-325 MG PO TABS: ORAL_TABLET | ORAL | 0 refills | Status: DC

## 2023-03-04 MED ORDER — METHOCARBAMOL 500 MG PO TABS: 500.0000 mg | ORAL_TABLET | Freq: Three times a day (TID) | ORAL | 0 refills | Status: DC

## 2023-03-04 MED ORDER — METHOCARBAMOL 500 MG PO TABS
500.0000 mg | ORAL_TABLET | Freq: Once | ORAL | Status: AC
  Administered 2023-03-04: 500 mg via ORAL
  Filled 2023-03-04: qty 1

## 2023-03-04 MED ORDER — OXYCODONE-ACETAMINOPHEN 5-325 MG PO TABS
1.0000 | ORAL_TABLET | Freq: Once | ORAL | Status: AC
  Administered 2023-03-04: 1 via ORAL
  Filled 2023-03-04: qty 1

## 2023-03-04 NOTE — Discharge Instructions (Signed)
You have been prescribed muscle relaxer and pain medication for your symptoms.  Do not drive or operate machinery while taking these medications as it may cause drowsiness.  I also recommend that you apply a over-the-counter 4% lidocaine patch to your shoulder and base of your neck.  Wear as directed.  Please follow-up with your primary care provider for recheck or you may contact the neurosurgery group listed to arrange follow-up appointment in 1 week week if your symptoms are not improving.  Return to the emergency department for any new or worsening symptoms.

## 2023-03-04 NOTE — ED Provider Notes (Signed)
[place] EMERGENCY DEPARTMENT AT [facility] Provider Note   CSN: [id number] Arrival date & time: 03/04/23  1327     History  Chief Complaint  Patient presents with   Arm Pain    Steven Tanner is a 52 y.o. male.   Arm Pain Pertinent negatives include no chest pain, no abdominal pain, no headaches and no shortness of breath.       Steven Tanner is a 52 y.o. male who presents to the Emergency Department complaining of sharp radiating pains and tingling sensation of right shoulder down into middle of the right hand.  Symptoms present for 1 week.  He endorses feeling a "crick" in his neck for 1 week.  Has pain with turning his neck to the right.  1 week ago, he had a cramp in his leg in the middle of the night.  He states he got up to massage his leg and when he laid back down he started having tingling sensations with sharp pains of his shoulder down into his arm and hand that have been persistent since.  Little relief with 800 mg ibuprofen.  Denies headache or dizziness, numbness or weakness of his extremities.  No chest pain or shortness of breath.  No visual changes or dizziness.   Home Medications Prior to Admission medications   Medication Sig Start Date End Date Taking? Authorizing Provider  aspirin EC 81 MG tablet Take 1 tablet (81 mg total) by mouth daily. 12/11/17   Antoine Poche, MD  atorvastatin (LIPITOR) 40 MG tablet Take 1 tablet (40 mg total) by mouth daily. Patient not taking: Reported on 12/20/2022 09/05/22   Mechele Claude, MD  Continuous Blood Gluc Sensor (FREESTYLE LIBRE 3 SENSOR) MISC Place 1 sensor on the skin every 14 days. Use to check glucose continuously 01/22/22   Mechele Claude, MD  glimepiride (AMARYL) 4 MG tablet Take 1 tablet (4 mg total) by mouth daily before breakfast. 12/20/22   Mechele Claude, MD  lisinopril (ZESTRIL) 10 MG tablet Take 1 tablet (10 mg total) by mouth daily. To protect kidneys from diabetic damage 09/05/22   Mechele Claude, MD  metFORMIN (GLUCOPHAGE) 1000 MG tablet Take 1 tablet (1,000 mg total) by mouth 2 (two) times daily with a meal. 09/05/22   Mechele Claude, MD  sildenafil (REVATIO) 20 MG tablet Take 2-5 tabs PRN prior to sexual activity 09/05/22   Mechele Claude, MD      Allergies    Codeine and Sulfa antibiotics    Review of Systems   Review of Systems  Constitutional:  Negative for chills and fever.  Eyes:  Negative for visual disturbance.  Respiratory:  Negative for cough, chest tightness and shortness of breath.   Cardiovascular:  Negative for chest pain.  Gastrointestinal:  Negative for abdominal pain, nausea and vomiting.  Genitourinary:  Negative for dysuria.  Musculoskeletal:  Positive for neck pain. Negative for back pain.  Skin:  Negative for rash.  Neurological:  Negative for dizziness, weakness, light-headedness, numbness and headaches.    Physical Exam Updated Vital Signs BP (!) 149/90 (BP Location: Left Arm)   Pulse 92   Temp 98.4 F (36.9 C) (Oral)   Resp 18   Ht 5\' 10"  (1.778 m)   Wt 111.1 kg   SpO2 99%   BMI 35.15 kg/m  Physical Exam Vitals and nursing note reviewed.  Constitutional:      General: He is not in acute distress.    Appearance: Normal appearance.  He is not ill-appearing or toxic-appearing.  HENT:     Head: Atraumatic.  Eyes:     Extraocular Movements: Extraocular movements intact.     Conjunctiva/sclera: Conjunctivae normal.     Pupils: Pupils are equal, round, and reactive to light.  Neck:     Trachea: Phonation normal.     Comments: Tenderness to palpation right SCM trapezius.  Pain reproduced with rotation to the right.  Mild tenderness midline without bony step-off.  No edema or skin changes. Cardiovascular:     Rate and Rhythm: Normal rate and regular rhythm.     Pulses: Normal pulses.  Pulmonary:     Effort: Pulmonary effort is normal.  Abdominal:     Palpations: Abdomen is soft.     Tenderness: There is no abdominal tenderness.   Musculoskeletal:     Cervical back: Tenderness present. No rigidity. Pain with movement, spinous process tenderness and muscular tenderness present. Decreased range of motion.     Right lower leg: No edema.     Left lower leg: No edema.  Lymphadenopathy:     Cervical: No cervical adenopathy.  Skin:    General: Skin is warm.     Capillary Refill: Capillary refill takes less than 2 seconds.     Findings: No erythema or rash.  Neurological:     General: No focal deficit present.     Mental Status: He is alert.     Sensory: Sensation is intact.     Motor: Motor function is intact. No weakness.     ED Results / Procedures / Treatments   Labs (all labs ordered are listed, but only abnormal results are displayed) Labs Reviewed  BASIC METABOLIC PANEL - Abnormal; Notable for the following components:      Result Value   Sodium 133 (*)    Glucose, Bld 345 (*)    All other components within normal limits  CBC WITH DIFFERENTIAL/PLATELET    EKG None  Radiology CT Cervical Spine Wo Contrast Result Date: 03/04/2023 CLINICAL DATA:  Cervical radiculopathy, no red flags. Right arm pain and tingling for 1 week. EXAM: CT CERVICAL SPINE WITHOUT CONTRAST TECHNIQUE: Multidetector CT imaging of the cervical spine was performed without intravenous contrast. Multiplanar CT image reconstructions were also generated. RADIATION DOSE REDUCTION: This exam was performed according to the departmental dose-optimization program which includes automated exposure control, adjustment of the mA and/or kV according to patient size and/or use of iterative reconstruction technique. COMPARISON:  None Available. FINDINGS: Alignment: Cervical spine straightening.  No listhesis. Skull base and vertebrae: No acute fracture or suspicious osseous lesion. Soft tissues and spinal canal: No prevertebral fluid or swelling. No visible canal hematoma. 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 Mild right and mild-to-moderate left neural foraminal stenosis at C4-5 and moderate right neural foraminal stenosis at C5-6 due to uncovertebral spurring. Mild spinal stenosis at C6-7 due to a broad-based posterior disc osteophyte complex and mild posterior longitudinal ligament ossification. Upper chest: Clear lung apices. Other: None. IMPRESSION: 1. No acute osseous abnormality. 2. Mild spinal stenosis at C2-3 and C6-7. 3. Moderate right neural foraminal stenosis at C5-6. Electronically Signed   By: Sebastian Ache M.D.   On: 03/04/2023 16:01    Procedures Procedures    Medications Ordered in ED Medications - No data to display  ED Course/ Medical Decision Making/ A&P  Medical Decision Making Patient here with 1 week history of right sided cervical radicular type pain.  Describes radiating pain from his right neck shoulder that radiates into his hand.  Also experiencing some paresthesias of the arm and hand that have been constant x 1 week.  Endorses a crick in his neck 1 to 2 weeks ago.  No history of prior cervical injury or surgery.  I suspect torticollis but may also be a radiculopathy.  Doubt infectious process or vertebral artery dissection.  Diabetic so will check labs  Amount and/or Complexity of Data Reviewed Labs: ordered.    Details: Labs interpreted by me, no evidence of leukocytosis, blood sugar 345 with reassuring anion gap Radiology: ordered.    Details: CT cervical spine shows no acute bony abnormality, there is mild spinal stenosis at C2-3 and C6-7.  Moderate right neural foraminal stenosis at C5 and 6 Discussion of management or test interpretation with external provider(s): Pt with likely cervical radiculopathy secondary to spinal stenosis of the cervical spine.  No red flags on exam.  NV intact.  Would likely benefit from steroids, but given his history of poorly controlled diabetes, will refrain from using steroids at this time.   Will treat symptomatically with pain medication, muscle relaxer and lidocaine patch.  Patient recommended close outpatient follow-up with PCP or neurosurgery.  Return precautions also discussed  Risk Prescription drug management.           Final Clinical Impression(s) / ED Diagnoses Final diagnoses:  Cervical radiculopathy    Rx / DC Orders ED Discharge Orders     None         Pauline Aus, PA-C 03/04/23 1642    Gloris Manchester, MD 03/05/23 [phone]

## 2023-03-04 NOTE — ED Triage Notes (Signed)
Pt c/o sharp pain and tingling in R arm x1 week.  Pain score 5/10.  Pt reports pain runs from shoulder to hand.  Denies injury.

## 2023-03-14 ENCOUNTER — Ambulatory Visit: Payer: Self-pay | Admitting: Nurse Practitioner

## 2023-03-14 ENCOUNTER — Telehealth: Payer: Self-pay | Admitting: *Deleted

## 2023-03-14 NOTE — Telephone Encounter (Signed)
Copied from CRM [id number]. Topic: Clinical - Medication Question >> Mar 14, 2023  2:49 PM Steven Tanner wrote: Reason for CRM: pt called in he was in the hospital last week and he do not want an appt but he was given rx at the hospital and the rx makes him tingle and its too strong for he wanted to know if he could get a rx for pain

## 2023-03-14 NOTE — Telephone Encounter (Signed)
Please schedule pt for ER follow up

## 2023-03-15 ENCOUNTER — Encounter: Payer: Self-pay | Admitting: Family

## 2023-03-15 ENCOUNTER — Telehealth (INDEPENDENT_AMBULATORY_CARE_PROVIDER_SITE_OTHER): Payer: Self-pay | Admitting: Family

## 2023-03-15 DIAGNOSIS — M5412 Radiculopathy, cervical region: Secondary | ICD-10-CM

## 2023-03-15 MED ORDER — GABAPENTIN 100 MG PO CAPS: 100.0000 mg | ORAL_CAPSULE | Freq: Three times a day (TID) | ORAL | 0 refills | Status: DC

## 2023-03-15 MED ORDER — IBUPROFEN 800 MG PO TABS: 800.0000 mg | ORAL_TABLET | Freq: Three times a day (TID) | ORAL | 0 refills | Status: DC | PRN

## 2023-03-15 NOTE — Telephone Encounter (Signed)
Pt is on controlled meds and PCP doesn't have any open slots in near future. Can he be worked in with PCP for this?

## 2023-03-15 NOTE — Progress Notes (Signed)
Virtual Visit Consent   Steven Tanner, you are scheduled for a virtual visit with a  provider today. Just as with appointments in the office, your consent must be obtained to participate. Your consent will be active for this visit and any virtual visit you may have with one of our providers in the next 365 days. If you have a MyChart account, a copy of this consent can be sent to you electronically.  As this is a virtual visit, video technology does not allow for your provider to perform a traditional examination. This may limit your provider's ability to fully assess your condition. If your provider identifies any concerns that need to be evaluated in person or the need to arrange testing (such as labs, EKG, etc.), we will make arrangements to do so. Although advances in technology are sophisticated, we cannot ensure that it will always work on either your end or our end. If the connection with a video visit is poor, the visit may have to be switched to a telephone visit. With either a video or telephone visit, we are not always able to ensure that we have a secure connection.  By engaging in this virtual visit, you consent to the provision of healthcare and authorize for your insurance to be billed (if applicable) for the services provided during this visit. Depending on your insurance coverage, you may receive a charge related to this service.  I need to obtain your verbal consent now. Are you willing to proceed with your visit today? Steven Tanner has provided verbal consent on 03/15/2023 for a virtual visit (video or telephone). Jannifer Rodney, FNP  Date: 03/15/2023 12:18 PM  Virtual Visit via Video Note   I, Jannifer Rodney, connected with  Steven Tanner  ([id number], September 03, 1970) on 03/15/23 at 12:25 PM EST by a video-enabled telemedicine application and verified that I am speaking with the correct person using two identifiers.  Location: Patient: Virtual Visit Location Patient:  Home Provider: Virtual Visit Location Provider: Home Office   I discussed the limitations of evaluation and management by telemedicine and the availability of in person appointments. The patient expressed understanding and agreed to proceed.    History of Present Illness: Steven Tanner is a 52 y.o. who identifies as a male who was assigned male at birth, and is being seen today for neck pain. He went to the ED on 03/04/23 and diagnosed with cervical radiculopathy. He was given Robaxin 500 mg and Norco. However, reports when he takes the Norco he gets tingling and does not like how it makes him feel.   HPI: Neck Pain  This is a new problem. The current episode started 1 to 4 weeks ago. The problem occurs constantly. The problem has been waxing and waning. The pain is associated with nothing. The pain is present in the right side (right arm). The quality of the pain is described as shooting and burning. The pain is at a severity of 3/10. The pain is moderate. The symptoms are aggravated by twisting and bending. He has tried NSAIDs for the symptoms. The treatment provided mild relief.    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 (Patient not taking: Reported on 12/20/2022), Disp: 90 tablet, Rfl: 3    Continuous Blood Gluc Sensor (FREESTYLE LIBRE 3 SENSOR) MISC, Place 1 sensor on the skin every 14 days. Use to check glucose continuously, Disp: 2 each, Rfl: 11   gabapentin (NEURONTIN) 100 MG capsule, Take 1 capsule (100 mg total) by mouth 3 (three) times daily., Disp: 90 capsule, Rfl: 0   glimepiride (AMARYL) 4 MG tablet, Take 1 tablet (4 mg total) by mouth daily before breakfast., Disp: 90 tablet, Rfl: 1   ibuprofen (ADVIL) 800 MG tablet, Take 1 tablet (800 mg total) by mouth every 8 (eight) hours as needed., Disp: 30 tablet, Rfl: 0   lisinopril (ZESTRIL) 10 MG tablet, Take 1 tablet (10 mg total) by mouth daily. To protect kidneys from diabetic damage, Disp: 90 tablet, Rfl: 3   metFORMIN (GLUCOPHAGE) 1000 MG tablet, Take 1 tablet (1,000 mg total) by mouth 2 (two) times daily with a meal., Disp: 180 tablet, Rfl: 3   methocarbamol (ROBAXIN) 500 MG tablet, Take 1 tablet (500 mg total) by mouth 3 (three) times daily., Disp: 21 tablet, Rfl: 0   sildenafil (REVATIO) 20 MG tablet, Take 2-5 tabs PRN prior to sexual activity, Disp: 50 tablet, Rfl: 5  Observations/Objective: Patient is well-developed, well-nourished in no acute distress.  Resting comfortably  at home.  Head is normocephalic, atraumatic.  No labored breathing.  Speech is clear and coherent with logical content.  Patient is alert and oriented at baseline.  Pain in neck with flexion and extension, full ROM   Assessment and Plan: 1. Cervical radiculopathy (Primary) - ibuprofen (ADVIL) 800 MG tablet; Take 1 tablet (800 mg total) by mouth every 8 (eight) hours as needed.  Dispense: 30 tablet; Refill: 0 - gabapentin (NEURONTIN) 100 MG capsule; Take 1 capsule (100 mg total) by mouth 3 (three) times daily.  Dispense: 90 capsule; Refill: 0  Rest ROM exercises  Stop Norco and start motrin 800 mg TID with food Can take gabapentin 100 mg TID prn  Follow up if symptoms worsen or do not improve   Follow Up Instructions: I discussed the  assessment and treatment plan with the patient. The patient was provided an opportunity to ask questions and all were answered. The patient agreed with the plan and demonstrated an understanding of the instructions.  A copy of instructions were sent to the patient via MyChart unless otherwise noted below.    The patient was advised to call back or seek an in-person evaluation if the symptoms worsen or if the condition fails to improve as anticipated.    Jannifer Rodney, FNP

## 2023-03-15 NOTE — Telephone Encounter (Signed)
Spoke with patient and scheduled him for a virtual visit today to discuss pain medications

## 2023-03-15 NOTE — Patient Instructions (Signed)
Cervical Radiculopathy  Cervical radiculopathy happens when a nerve in the neck (a cervical nerve) is pinched or bruised. This condition can happen because of an injury to the cervical spine (vertebrae) in the neck, or as part of the normal aging process. Pressure on the cervical nerves can cause pain or numbness that travels from the neck all the way down to the arm and fingers. This condition usually gets better with rest. Treatment may be needed if the condition does not improve. What are the causes? This condition may be caused by: A neck injury. A bulging (herniated) disk. Muscle spasms. Muscle tightness in the neck due to overuse. Arthritis. Breakdown or degeneration in the bones and joints of the spine (spondylosis) due to aging. Bone spurs that may develop near the cervical nerves. What are the signs or symptoms? Symptoms of this condition include: Pain. The pain may travel from the neck to the arm and hand. The pain can be severe or irritating. It may get worse when you move your neck. Numbness or tingling in your arm or hand. Weakness in the affected arm and hand, in severe cases. How is this diagnosed? This condition may be diagnosed based on your symptoms, your medical history, and a physical exam. You may also have tests, including: X-rays. CT scan. MRI. Electromyogram (EMG). Nerve conduction tests. How is this treated? In many cases, treatment is not needed for this condition. With rest, the condition usually gets better over time. If treatment is needed, options may include: Wearing a soft neck collar (cervical collar) for short periods of time. Doing physical therapy to strengthen your neck muscles. Taking medicines. These may include NSAIDs, such as ibuprofen, or oral corticosteroids. Having spinal injections, in severe cases. Having surgery. This may be needed if other treatments do not help. Different types of surgery may be done depending on the cause of this  condition. Follow these instructions at home: If you have a cervical collar: Wear it as told by your health care provider. Remove it only as told by your health care provider. Ask your health care provider if you can remove the cervical collar for cleaning and bathing. If you are allowed to remove the collar for cleaning or bathing: Follow instructions from your health care provider about how to remove the collar safely. Clean the collar by wiping it with mild soap and water and drying it completely. Take out any removable pads in the collar every 1-2 days, and wash them by hand with soap and water. Let them air-dry completely before you put them back in the collar. Check your skin under the collar for irritation or sores. If you see any, tell your health care provider. Managing pain     Take over-the-counter and prescription medicines only as told by your health care provider. If directed, put ice on the affected area. To do this: If you have a soft neck collar, remove it as told by your health care provider. Put ice in a plastic bag. Place a towel between your skin and the bag. Leave the ice on for 20 minutes, 2-3 times a day. Remove the ice if your skin turns bright red. This is very important. If you cannot feel pain, heat, or cold, you have a greater risk of damage to the area. If applying ice does not help, you can try using heat. Use the heat source that your health care provider recommends, such as a moist heat pack or a heating pad. Place a towel between   your skin and the heat source. Leave the heat on for 20-30 minutes. Remove the heat if your skin turns bright red. This is especially important if you are unable to feel pain, heat, or cold. You have a greater risk of getting burned. Try a gentle neck and shoulder massage to help relieve symptoms. Activity Rest as needed. Return to your normal activities as told by your health care provider. Ask your health care provider what  activities are safe for you. Do stretching and strengthening exercises as told by your health care provider or your physical therapist. You may have to avoid lifting. Ask your health care provider how much you can safely lift. General instructions Use a flat pillow when you sleep. Do not drive while wearing a cervical collar. If you do not have a cervical collar, ask your health care provider if it is safe to drive while your neck heals. Ask your health care provider if the medicine prescribed to you requires you to avoid driving or using machinery. Do not use any products that contain nicotine or tobacco. These products include cigarettes, chewing tobacco, and vaping devices, such as e-cigarettes. If you need help quitting, ask your health care provider. Keep all follow-up visits. This is important. Contact a health care provider if: Your condition does not improve with treatment. Get help right away if: Your pain gets much worse and is not controlled with medicines. You have weakness or numbness in your hand, arm, face, or leg. You have a high fever. You have a stiff, rigid neck. You lose control of your bowels or your bladder (have incontinence). You have trouble with walking, balance, or speaking. Summary Cervical radiculopathy happens when a nerve in the neck is pinched or bruised. A nerve can get pinched from a bulging disk, arthritis, muscle spasms, or an injury to the neck. Symptoms include pain, tingling, or numbness radiating from the neck to the arm or hand. Weakness can also occur in severe cases. Treatment may include rest, wearing a cervical collar, and physical therapy. Medicines may be prescribed to help with pain. In severe cases, injections or surgery may be needed. This information is not intended to replace advice given to you by your health care provider. Make sure you discuss any questions you have with your health care provider. Document Revised: 09/08/2020 Document  Reviewed: 09/08/2020 Elsevier Patient Education  2024 [employer]  

## 2023-03-25 ENCOUNTER — Ambulatory Visit: Payer: Self-pay | Admitting: Family Medicine

## 2023-04-10 ENCOUNTER — Ambulatory Visit: Payer: Self-pay | Admitting: Family Medicine

## 2023-04-15 ENCOUNTER — Ambulatory Visit: Payer: Self-pay | Admitting: Family Medicine

## 2023-04-15 NOTE — Telephone Encounter (Signed)
  Chief Complaint: coughing Symptoms: coughing, chest tightness,  Frequency: a week ago Pertinent Negatives: Patient denies fever, SOB, chest pain, hemoptysis Disposition: [] ED /[] Urgent Care (no appt availability in office) / [] Appointment(In office/virtual)/ []  [facility]/ [x] Home Care/ [] Refused Recommended Disposition /[] [facility]/ []  Follow-up with PCP Additional Notes: Patient c/o coughing s/p PNA treatment and inquiring about OTC medications. Patient reports he was recently dx with PNA while in [place] and finished course of abx/steroids Friday. He endorses continued productive cough, and chest tightness r/t coughing, but improved since PNA dx. Denies fevers, SOB, chest pain. Reinforced home care disposition. Patient verbalized understanding and to call back with worsening symptoms.   Copied from CRM [id number]. Topic: Clinical - Red Word Triage >> Apr 15, 2023  4:28 PM Geroge Baseman wrote: Red Word that prompted transfer to Nurse Triage: Pneumonia went away but now feels like it is coming back. He is having trouble speaking his voice going on, gets into coughing spells, tighness in his chest. Reason for Disposition  Cough  Answer Assessment - Initial Assessment Questions 1. ONSET: "When did the cough begin?"      A week ago 2. SEVERITY: "How bad is the cough today?"      No, denies difficulty breathing. It's not like when he was dx of PNA 3. SPUTUM: "Describe the color of your sputum" (none, dry cough; clear, white, yellow, green)     A little, clear/yellow 4. HEMOPTYSIS: "Are you coughing up any blood?" If so ask: "How much?" (flecks, streaks, tablespoons, etc.)     no 5. DIFFICULTY BREATHING: "Are you having difficulty breathing?" If Yes, ask: "How bad is it?" (e.g., mild, moderate, severe)    - MILD: No SOB at rest, mild SOB with walking, speaks normally in sentences, can lie down, no retractions, pulse < 100.    - MODERATE: SOB at rest, SOB with minimal  exertion and prefers to sit, cannot lie down flat, speaks in phrases, mild retractions, audible wheezing, pulse 100-120.    - SEVERE: Very SOB at rest, speaks in single words, struggling to breathe, sitting hunched forward, retractions, pulse > 120      no 6. FEVER: "Do you have a fever?" If Yes, ask: "What is your temperature, how was it measured, and when did it start?"     no 7. CARDIAC HISTORY: "Do you have any history of heart disease?" (e.g., heart attack, congestive heart failure)      no 8. LUNG HISTORY: "Do you have any history of lung disease?"  (e.g., pulmonary embolus, asthma, emphysema)     no 9. PE RISK FACTORS: "Do you have a history of blood clots?" (or: recent major surgery, recent prolonged travel, bedridden)     no 10. OTHER SYMPTOMS: "Do you have any other symptoms?" (e.g., runny nose, wheezing, chest pain)       A little wheezing with cough Not chest pain, but chest tightness  12. TRAVEL: "Have you traveled out of the country in the last month?" (e.g., travel history, exposures)       Was in [place], recently dx with PNA - took steroids and abx, finished Friday  Protocols used: Cough - Acute Productive-A-AH

## 2023-04-29 ENCOUNTER — Ambulatory Visit: Payer: PRIVATE HEALTH INSURANCE | Admitting: Family Medicine

## 2023-04-29 ENCOUNTER — Encounter: Payer: Self-pay | Admitting: Family Medicine

## 2023-04-29 VITALS — BP 117/77 | HR 102 | Temp 97.9°F | Ht 70.0 in | Wt 250.0 lb

## 2023-04-29 DIAGNOSIS — Z7984 Long term (current) use of oral hypoglycemic drugs: Secondary | ICD-10-CM

## 2023-04-29 DIAGNOSIS — E782 Mixed hyperlipidemia: Secondary | ICD-10-CM

## 2023-04-29 DIAGNOSIS — E1165 Type 2 diabetes mellitus with hyperglycemia: Secondary | ICD-10-CM

## 2023-04-29 LAB — LIPID PANEL

## 2023-04-29 LAB — BAYER DCA HB A1C WAIVED: HB A1C (BAYER DCA - WAIVED): 14 % — ABNORMAL HIGH (ref 4.8–5.6)

## 2023-04-30 ENCOUNTER — Telehealth: Payer: Self-pay

## 2023-04-30 ENCOUNTER — Telehealth: Payer: Self-pay | Admitting: Family Medicine

## 2023-04-30 ENCOUNTER — Other Ambulatory Visit: Payer: Self-pay | Admitting: Family Medicine

## 2023-04-30 ENCOUNTER — Other Ambulatory Visit (HOSPITAL_COMMUNITY): Payer: Self-pay

## 2023-04-30 LAB — CBC WITH DIFFERENTIAL/PLATELET
Basophils Absolute: 0 10*3/uL (ref 0.0–0.2)
Basos: 1 %
EOS (ABSOLUTE): 0.2 10*3/uL (ref 0.0–0.4)
Eos: 3 %
Hematocrit: 43.1 % (ref 37.5–51.0)
Hemoglobin: 14 g/dL (ref 13.0–17.7)
Immature Grans (Abs): 0 10*3/uL (ref 0.0–0.1)
Immature Granulocytes: 0 %
Lymphocytes Absolute: 3.4 10*3/uL — ABNORMAL HIGH (ref 0.7–3.1)
Lymphs: 54 %
MCH: 27.2 pg (ref 26.6–33.0)
MCHC: 32.5 g/dL (ref 31.5–35.7)
MCV: 84 fL (ref 79–97)
Monocytes Absolute: 0.7 10*3/uL (ref 0.1–0.9)
Monocytes: 10 %
Neutrophils Absolute: 2 10*3/uL (ref 1.4–7.0)
Neutrophils: 32 %
Platelets: 255 10*3/uL (ref 150–450)
RBC: 5.14 x10E6/uL (ref 4.14–5.80)
RDW: 13.4 % (ref 11.6–15.4)
WBC: 6.3 10*3/uL (ref 3.4–10.8)

## 2023-04-30 LAB — CMP14+EGFR
ALT: 35 IU/L (ref 0–44)
AST: 19 IU/L (ref 0–40)
Albumin: 4.1 g/dL (ref 4.1–5.1)
Alkaline Phosphatase: 89 IU/L (ref 44–121)
BUN/Creatinine Ratio: 15 (ref 9–20)
BUN: 17 mg/dL (ref 6–24)
Bilirubin Total: 0.3 mg/dL (ref 0.0–1.2)
CO2: 16 mmol/L — ABNORMAL LOW (ref 20–29)
Calcium: 9.3 mg/dL (ref 8.7–10.2)
Chloride: 99 mmol/L (ref 96–106)
Creatinine, Ser: 1.13 mg/dL (ref 0.76–1.27)
Globulin, Total: 2.7 g/dL (ref 1.5–4.5)
Glucose: 376 mg/dL — ABNORMAL HIGH (ref 70–99)
Potassium: 4.3 mmol/L (ref 3.5–5.2)
Sodium: 136 mmol/L (ref 134–144)
Total Protein: 6.8 g/dL (ref 6.0–8.5)
eGFR: 79 mL/min/{1.73_m2} (ref >59)

## 2023-04-30 LAB — LIPID PANEL
Cholesterol, Total: 177 mg/dL (ref 100–199)
HDL: 28 mg/dL — ABNORMAL LOW (ref >39)
LDL CALC COMMENT:: 6.3 ratio — ABNORMAL HIGH (ref 0.0–5.0)
LDL Chol Calc (NIH): 64 mg/dL (ref 0–99)
Triglycerides: 553 mg/dL (ref 0–149)
VLDL Cholesterol Cal: 85 mg/dL — ABNORMAL HIGH (ref 5–40)

## 2023-04-30 MED ORDER — OZEMPIC (1 MG/DOSE) 4 MG/3ML ~~LOC~~ SOPN: 1.0000 mg | PEN_INJECTOR | SUBCUTANEOUS | 5 refills | Status: DC

## 2023-04-30 MED ORDER — OZEMPIC (0.25 OR 0.5 MG/DOSE) 2 MG/3ML ~~LOC~~ SOPN: PEN_INJECTOR | SUBCUTANEOUS | 1 refills | Status: DC

## 2023-04-30 NOTE — Telephone Encounter (Signed)
Please let the patient know that I sent their prescription to their pharmacy. Thanks, WS

## 2023-04-30 NOTE — Telephone Encounter (Signed)
Pharmacy Patient Advocate Encounter   Received notification from Pt Calls Messages that prior authorization for [facility] is required/requested.   Insurance verification completed.   The patient is insured through  [facility]  .   Per test claim: PA required and submitted KEY/EOC/Request #: [id number] APPROVED from 04/30/23 to 04/29/24

## 2023-04-30 NOTE — Telephone Encounter (Unsigned)
Copied from CRM [id number]. Topic: Clinical - Prescription Issue >> Apr 30, 2023  3:02 PM Dimitri Ped wrote: Reason for CRM: patient is calling back concerning the approval of prior authorization. Relayed to patient that pa was approved for ozempic  . Dont see medication on med list to see where it was sent . Patient says he picks it up at the [id number] - [place], [place] - [address] [place] [address] Phone: [phone] Fax: [phone] Hours: Not open 24 hours Patient needs to know since it has been approved where would it be sent to. Patient says he picks it up at [facility] .  [phone]

## 2023-04-30 NOTE — Telephone Encounter (Signed)
PA request has been Approved. New Encounter created for follow up. For additional info see Pharmacy Prior Auth telephone encounter from 04/30/23.

## 2023-04-30 NOTE — Telephone Encounter (Signed)
Copied from CRM [id number]. Topic: Clinical - Medication Question >> Apr 30, 2023  8:02 AM Carlatta H wrote: Reason for CRM: Patient needs to have Ozempic pre authorized with insurance//Pharmacy also advised medication needs pre authorization//Please call patient to let him know once approved

## 2023-05-01 ENCOUNTER — Other Ambulatory Visit: Payer: Self-pay | Admitting: Family Medicine

## 2023-05-01 ENCOUNTER — Telehealth: Payer: Self-pay | Admitting: Family Medicine

## 2023-05-01 MED ORDER — PIOGLITAZONE HCL 30 MG PO TABS: 30.0000 mg | ORAL_TABLET | Freq: Every day | ORAL | 2 refills | Status: DC

## 2023-05-01 NOTE — Telephone Encounter (Signed)
Pt is aware of PA approval but he has a high deductible that he has to meet first. This is in another encounter under a refill change request.

## 2023-05-01 NOTE — Telephone Encounter (Signed)
Pt has a high deductible even though the PA was approved.

## 2023-05-01 NOTE — Telephone Encounter (Signed)
Duplicate encounter, addressing in refill encounter. Pt has a high deductible he has to meet first.

## 2023-05-01 NOTE — Telephone Encounter (Signed)
Copied from CRM [id number]. Topic: Clinical - Prescription Issue >> May 01, 2023  8:15 AM Geroge Baseman wrote: Reason for CRM: Patient is calling because his insurance is not paying for his ozempic. He said dr Darlyn Read suggested to call into the office to see about getting assistance. Before he goes to work ([employer]) he is wanting to get something filled today before he goes to another state. Please call to advise.

## 2023-05-01 NOTE — Telephone Encounter (Signed)
Name from pharmacy: OZEMPIC 0.25-0.5 MG/DOSE PEN  Pharmacy comment: Alternative Requested:ALTERNATIVE MEDICINE REQUESTED FOR OZEMPIC BECAUSE THE PRICE ON INSURANCE IS $957.

## 2023-05-03 ENCOUNTER — Encounter: Payer: Self-pay | Admitting: Family Medicine

## 2023-05-03 MED ORDER — ATORVASTATIN CALCIUM 40 MG PO TABS: 40.0000 mg | ORAL_TABLET | Freq: Every day | ORAL | 3 refills | Status: DC

## 2023-05-03 MED ORDER — LISINOPRIL 10 MG PO TABS: 10.0000 mg | ORAL_TABLET | Freq: Every day | ORAL | 3 refills | Status: DC

## 2023-05-03 MED ORDER — METFORMIN HCL 1000 MG PO TABS: 1000.0000 mg | ORAL_TABLET | Freq: Two times a day (BID) | ORAL | 3 refills | Status: DC

## 2023-05-03 NOTE — Progress Notes (Addendum)
Subjective:  Patient ID: Steven Tanner,  male    DOB: 1970/04/12  Age: 53 y.o.    CC: Diabetes (Would like to go back on ozempic)   HPI Steven Tanner presents for  follow-up of hypertension. Patient has no history of headache chest pain or shortness of breath or recent cough. Patient also denies symptoms of TIA such as numbness weakness lateralizing. Patient denies side effects from medication. States taking it regularly.  Patient also  in for follow-up of elevated cholesterol. Doing well without complaints on current medication. Denies side effects  including myalgia and arthralgia and nausea. Also in today for liver function testing. Currently no chest pain, shortness of breath or other cardiovascular related symptoms noted.  Follow-up of diabetes. Patient does check blood sugar at home. Readings run 250 and above Recently on steroids for pneumonia. Some persistent cough Patient denies symptoms such as excessive hunger or urinary frequency, excessive hunger, nausea No significant hypoglycemic spells noted. Medications reviewed. Pt reports getting off track due to lack of insurance for several months. Resumed them after Jan 1. Now has insurance and wants to try. GLP.   History Nikoli has a past medical history of Abscess, Diabetes (HCC), and GERD (gastroesophageal reflux disease).   He has a past surgical history that includes Incise and drain abcess; Pilonidal cyst excision (N/A, 11/02/2013); Colonoscopy with propofol (N/A, 04/03/2022); and polypectomy (04/03/2022).   His family history includes Diabetes in his father; Heart disease in his father; Hypertension in his father and mother.He reports that he quit smoking about 5 years ago. His smoking use included cigarettes and cigars. He started smoking about 28 years ago. He has a 22.4 pack-year smoking history. He has never used smokeless tobacco. He reports current alcohol use. He reports that he does not currently use drugs after having  used the following drugs: Marijuana.  Current Outpatient Medications on File Prior to Visit  Medication Sig Dispense Refill   aspirin EC 81 MG tablet Take 1 tablet (81 mg total) by mouth daily. 90 tablet 3   Continuous Blood Gluc Sensor (FREESTYLE LIBRE 3 SENSOR) MISC Place 1 sensor on the skin every 14 days. Use to check glucose continuously 2 each 11   gabapentin (NEURONTIN) 100 MG capsule Take 1 capsule (100 mg total) by mouth 3 (three) times daily. 90 capsule 0   glimepiride (AMARYL) 4 MG tablet Take 1 tablet (4 mg total) by mouth daily before breakfast. 90 tablet 1   ibuprofen (ADVIL) 800 MG tablet Take 1 tablet (800 mg total) by mouth every 8 (eight) hours as needed. 30 tablet 0   methocarbamol (ROBAXIN) 500 MG tablet Take 1 tablet (500 mg total) by mouth 3 (three) times daily. 21 tablet 0   sildenafil (REVATIO) 20 MG tablet Take 2-5 tabs PRN prior to sexual activity 50 tablet 5   No current facility-administered medications on file prior to visit.    ROS Review of Systems  Constitutional:  Negative for fever.  Respiratory:  Negative for shortness of breath.   Cardiovascular:  Negative for chest pain.  Musculoskeletal:  Negative for arthralgias.  Skin:  Negative for rash.    Objective:  BP 117/77   Pulse (!) 102   Temp 97.9 F (36.6 C)   Ht 5\' 10"  (1.778 m)   Wt 250 lb (113.4 kg)   SpO2 98%   BMI 35.87 kg/m   BP Readings from Last 3 Encounters:  04/29/23 117/77  03/04/23 (!) 149/90  12/20/22 118/65  Wt Readings from Last 3 Encounters:  04/29/23 250 lb (113.4 kg)  03/04/23 245 lb (111.1 kg)  12/20/22 250 lb 6.4 oz (113.6 kg)     Physical Exam Vitals reviewed.  Constitutional:      Appearance: He is well-developed.  HENT:     Head: Normocephalic and atraumatic.     Right Ear: External ear normal.     Left Ear: External ear normal.     Mouth/Throat:     Pharynx: No oropharyngeal exudate or posterior oropharyngeal erythema.  Eyes:     Pupils: Pupils are  equal, round, and reactive to light.  Cardiovascular:     Rate and Rhythm: Normal rate and regular rhythm.     Heart sounds: No murmur heard. Pulmonary:     Effort: No respiratory distress.     Breath sounds: Normal breath sounds.  Musculoskeletal:     Cervical back: Normal range of motion and neck supple.  Neurological:     Mental Status: He is alert and oriented to person, place, and time.     Diabetic Foot Exam - Simple   No data filed     Lab Results  Component Value Date   HGBA1C 14.0 (H) 04/29/2023   HGBA1C 8.8 (H) 12/20/2022   HGBA1C 10.2 (H) 09/05/2022    Assessment & Plan:   Steven Tanner was seen today for diabetes.  Diagnoses and all orders for this visit:  Uncontrolled type 2 diabetes mellitus with hyperglycemia (HCC) -     Bayer DCA Hb A1c Waived -     CBC with Differential/Platelet -     metFORMIN (GLUCOPHAGE) 1000 MG tablet; Take 1 tablet (1,000 mg total) by mouth 2 (two) times daily with a meal.  Mixed hyperlipidemia -     CMP14+EGFR -     Lipid panel -     atorvastatin (LIPITOR) 40 MG tablet; Take 1 tablet (40 mg total) by mouth daily.  Other orders -     lisinopril (ZESTRIL) 10 MG tablet; Take 1 tablet (10 mg total) by mouth daily. To protect kidneys from diabetic damage   I am having Janae Sauce maintain his aspirin EC, FreeStyle Libre 3 Sensor, sildenafil, glimepiride, methocarbamol, ibuprofen, gabapentin, lisinopril, atorvastatin, and metFORMIN.  Meds ordered this encounter  Medications   lisinopril (ZESTRIL) 10 MG tablet    Sig: Take 1 tablet (10 mg total) by mouth daily. To protect kidneys from diabetic damage    Dispense:  90 tablet    Refill:  3   atorvastatin (LIPITOR) 40 MG tablet    Sig: Take 1 tablet (40 mg total) by mouth daily.    Dispense:  90 tablet    Refill:  3   metFORMIN (GLUCOPHAGE) 1000 MG tablet    Sig: Take 1 tablet (1,000 mg total) by mouth 2 (two) times daily with a meal.    Dispense:  180 tablet    Refill:  3      Follow-up: Return in about 3 months (around 07/27/2023) for diabetes.  Mechele Claude, M.D.

## 2023-05-08 ENCOUNTER — Encounter: Payer: Self-pay | Admitting: Family Medicine

## 2023-06-10 ENCOUNTER — Ambulatory Visit: Payer: PRIVATE HEALTH INSURANCE | Admitting: Family Medicine

## 2023-06-11 ENCOUNTER — Encounter: Payer: Self-pay | Admitting: Family Medicine

## 2023-06-11 ENCOUNTER — Ambulatory Visit (INDEPENDENT_AMBULATORY_CARE_PROVIDER_SITE_OTHER): Payer: PRIVATE HEALTH INSURANCE | Admitting: Family Medicine

## 2023-06-11 VITALS — BP 127/76 | HR 89 | Temp 98.2°F | Ht 70.0 in | Wt 264.0 lb

## 2023-06-11 DIAGNOSIS — E1165 Type 2 diabetes mellitus with hyperglycemia: Secondary | ICD-10-CM | POA: Diagnosis not present

## 2023-06-11 DIAGNOSIS — Z7984 Long term (current) use of oral hypoglycemic drugs: Secondary | ICD-10-CM | POA: Diagnosis not present

## 2023-06-11 LAB — BAYER DCA HB A1C WAIVED: HB A1C (BAYER DCA - WAIVED): 13.2 % — ABNORMAL HIGH (ref 4.8–5.6)

## 2023-06-11 MED ORDER — GLIMEPIRIDE 4 MG PO TABS: 4.0000 mg | ORAL_TABLET | Freq: Every day | ORAL | 1 refills | Status: DC

## 2023-06-11 NOTE — Progress Notes (Signed)
 Subjective:  Patient ID: Steven Tanner,  male    DOB: 02-15-1971  Age: 53 y.o.    CC: Medical Management of Chronic Issues (No concerns at this time. )   HPI Steven Tanner presents for  follow-up of hypertension. Patient has no history of headache chest pain or shortness of breath or recent cough. Patient also denies symptoms of TIA such as numbness weakness lateralizing. Patient denies side effects from medication. States taking it regularly.  Follow-up of diabetes. Patient does check blood sugar at home. Readings run between 100 and 200 Patient denies symptoms such as excessive hunger or urinary frequency, excessive hunger, nausea No significant hypoglycemic spells noted. Has noted increased appetite. Constant uncontrollable hunger.  Medications reviewed. Couldn't afford the Ozempic.  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  History Steven Tanner has a past medical history of Abscess, Diabetes (HCC), and GERD (gastroesophageal reflux disease).   He has a past surgical history that includes Incise and drain abcess; Pilonidal cyst excision (N/A, 11/02/2013); Colonoscopy with propofol (N/A, 04/03/2022); and polypectomy (04/03/2022).   His family history includes Diabetes in his father; Heart disease in his father; Hypertension in his father and mother.He reports that he quit smoking about 5 years ago. His smoking use included cigarettes and cigars. He started smoking about 28 years ago. He has a 22.4 pack-year smoking history. He has never used smokeless tobacco. He reports current alcohol use. He reports that he does not currently use drugs after having used the following drugs: Marijuana.  Current Outpatient Medications on File Prior to Visit  Medication Sig Dispense Refill   aspirin EC 81 MG tablet Take 1 tablet (81 mg total) by mouth daily. 90 tablet 3   atorvastatin (LIPITOR) 40 MG tablet Take 1 tablet (40 mg total) by mouth daily. 90 tablet 3   Continuous Blood Gluc Sensor  (FREESTYLE LIBRE 3 SENSOR) MISC Place 1 sensor on the skin every 14 days. Use to check glucose continuously 2 each 11   gabapentin (NEURONTIN) 100 MG capsule Take 1 capsule (100 mg total) by mouth 3 (three) times daily. 90 capsule 0   ibuprofen (ADVIL) 800 MG tablet Take 1 tablet (800 mg total) by mouth every 8 (eight) hours as needed. 30 tablet 0   lisinopril (ZESTRIL) 10 MG tablet Take 1 tablet (10 mg total) by mouth daily. To protect kidneys from diabetic damage 90 tablet 3   metFORMIN (GLUCOPHAGE) 1000 MG tablet Take 1 tablet (1,000 mg total) by mouth 2 (two) times daily with a meal. 180 tablet 3   methocarbamol (ROBAXIN) 500 MG tablet Take 1 tablet (500 mg total) by mouth 3 (three) times daily. 21 tablet 0   sildenafil (REVATIO) 20 MG tablet Take 2-5 tabs PRN prior to sexual activity 50 tablet 5   No current facility-administered medications on file prior to visit.    ROS Review of Systems  Constitutional:  Negative for fever.  Respiratory:  Negative for shortness of breath.   Cardiovascular:  Negative for chest pain.  Musculoskeletal:  Negative for arthralgias.  Skin:  Negative for rash.    Objective:  BP 127/76   Pulse 89   Temp 98.2 F (36.8 C)   Ht 5\' 10"  (1.778 m)   Wt 264 lb (119.7 kg)   SpO2 98%   BMI 37.88 kg/m   BP Readings from Last 3 Encounters:  06/11/23 127/76  04/29/23 117/77  03/04/23 (!) 149/90    Wt Readings from Last 3 Encounters:  06/11/23 264 lb (119.7 kg)  04/29/23 250 lb (113.4 kg)  03/04/23 245 lb (111.1 kg)    Lab Results  Component Value Date   HGBA1C 13.2 (H) 06/11/2023   HGBA1C 14.0 (H) 04/29/2023   HGBA1C 8.8 (H) 12/20/2022    Physical Exam Vitals reviewed.  Constitutional:      Appearance: He is well-developed.  HENT:     Head: Normocephalic and atraumatic.     Right Ear: External ear normal.     Left Ear: External ear normal.     Mouth/Throat:     Pharynx: No oropharyngeal exudate or posterior oropharyngeal erythema.   Eyes:     Pupils: Pupils are equal, round, and reactive to light.  Cardiovascular:     Rate and Rhythm: Normal rate and regular rhythm.     Heart sounds: No murmur heard. Pulmonary:     Effort: No respiratory distress.     Breath sounds: Normal breath sounds.  Musculoskeletal:     Cervical back: Normal range of motion and neck supple.  Neurological:     Mental Status: He is alert and oriented to person, place, and time.      Diabetic foot exam was performed with the following findings:   No deformities, ulcerations, or other skin breakdown Normal sensation of 10g monofilament Intact posterior tibialis and dorsalis pedis pulses      Assessment & Plan:  Uncontrolled type 2 diabetes mellitus with hyperglycemia (HCC) -     Bayer DCA Hb A1c Waived -     Amb Referral to Clinical Pharmacist  Other orders -     Glimepiride; Take 1 tablet (4 mg total) by mouth daily before breakfast.  Dispense: 90 tablet; Refill: 1    Follow-up: Return in about 6 weeks (around 07/23/2023).  Mechele Claude, M.D.

## 2023-06-19 ENCOUNTER — Other Ambulatory Visit: Payer: Self-pay | Admitting: Family Medicine

## 2023-06-19 DIAGNOSIS — E1165 Type 2 diabetes mellitus with hyperglycemia: Secondary | ICD-10-CM

## 2023-06-26 ENCOUNTER — Telehealth: Payer: Self-pay

## 2023-06-26 NOTE — Progress Notes (Signed)
 Care Guide Pharmacy Note  06/26/2023 Name: [name] MRN: [id number] DOB: 06-21-1970  Referred By: Mechele Claude, MD Reason for referral: Complex Care Management (Outreach to schedule with pharm d )   Steven Tanner is a 53 y.o. year old male who is a primary care patient of [name], [name], MD.  CORTLAND CREHAN was referred to the pharmacist for assistance related to: DMII  Successful contact was made with the patient to discuss pharmacy services including being ready for the pharmacist to call at least 5 minutes before the scheduled appointment time and to have medication bottles and any blood pressure readings ready for review. The patient agreed to meet with the pharmacist via telephone visit on (date/time).08/01/2023  Penne Lash , RMA     [place]  [facility] Guide  Direct Dial: [phone]  Website: [email or web address]

## 2023-07-22 ENCOUNTER — Ambulatory Visit (INDEPENDENT_AMBULATORY_CARE_PROVIDER_SITE_OTHER): Payer: Self-pay | Admitting: Family Medicine

## 2023-07-22 ENCOUNTER — Encounter: Payer: Self-pay | Admitting: Family Medicine

## 2023-07-22 VITALS — BP 118/69 | HR 82 | Temp 98.1°F | Ht 70.0 in | Wt 268.0 lb

## 2023-07-22 DIAGNOSIS — Z7985 Long-term (current) use of injectable non-insulin antidiabetic drugs: Secondary | ICD-10-CM | POA: Diagnosis not present

## 2023-07-22 DIAGNOSIS — E1165 Type 2 diabetes mellitus with hyperglycemia: Secondary | ICD-10-CM | POA: Diagnosis not present

## 2023-07-22 MED ORDER — FREESTYLE LIBRE 3 SENSOR MISC: 11 refills | Status: AC

## 2023-07-22 MED ORDER — OZEMPIC (0.25 OR 0.5 MG/DOSE) 2 MG/3ML ~~LOC~~ SOPN: 0.5000 mg | PEN_INJECTOR | SUBCUTANEOUS | 1 refills | Status: DC

## 2023-07-22 NOTE — Progress Notes (Signed)
 Subjective:  Patient ID: Steven Tanner, male    DOB: 1970/12/05  Age: 53 y.o. MRN: [id number]  CC: Med Check   HPI [name] presents for uncontrolled DM 162 5 days ago.  He has not yet gotten the GLP-1.  He is still waiting for a contact from the pharmacy that is not scheduled until 15 May.  He does not check his sugar very often but it the 162 was noted to have been fasting about 5 days ago while taking his current regimen of glimepiride  and metformin .  He is working harder on his diet.  We discussed carbohydrates in the form of starches sodas and of course concentrated sweets.  I reviewed with him the complex carbohydrates that would be useful for him but not causing a rapid peak.  He understands that these are whole grains including rice and bread and [place] etc. he understands that sweet potato is are preferable to [place] potatoes and the corn has no complex carbohydrate analog.  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  I recommend he go ahead and try to get his Ozempic  with the new insurance to see what his coverage is.     04/29/2023    8:33 AM 12/20/2022   11:41 AM 12/20/2022   11:40 AM  Depression screen PHQ 2/9  Decreased Interest 1 1 0  Down, Depressed, Hopeless 0 0 0  PHQ - 2 Score 1 1 0  Altered sleeping 1 1   Tired, decreased energy 1 1   Change in appetite 1 1   Feeling bad or failure about yourself  0 0   Trouble concentrating 1 1   Moving slowly or fidgety/restless 0 0   Suicidal thoughts 0 0   PHQ-9 Score 5 5   Difficult doing work/chores Not difficult at all Not difficult at all     History [name] has a past medical history of Abscess, Diabetes (HCC), and GERD (gastroesophageal reflux disease).   He has a past surgical history that includes Incise and drain abcess; Pilonidal cyst excision (N/A, 11/02/2013); Colonoscopy with propofol  (N/A, 04/03/2022); and polypectomy (04/03/2022).   His family history includes Diabetes in his father; Heart  disease in his father; Hypertension in his father and mother.He reports that he quit smoking about 6 years ago. His smoking use included cigarettes and cigars. He started smoking about 28 years ago. He has a 22.4 pack-year smoking history. He has never used smokeless tobacco. He reports current alcohol use. He reports that he does not currently use drugs after having used the following drugs: Marijuana.    ROS Review of Systems  Constitutional:  Negative for fever.  Respiratory:  Negative for shortness of breath.   Cardiovascular:  Negative for chest pain.  Musculoskeletal:  Negative for arthralgias.  Skin:  Negative for rash.    Objective:  BP 118/69   Pulse 82   Temp 98.1 F (36.7 C)   Ht 5\' 10"  (1.778 m)   Wt 268 lb (121.6 kg)   SpO2 98%   BMI 38.45 kg/m   BP Readings from Last 3 Encounters:  07/22/23 118/69  06/11/23 127/76  04/29/23 117/77    Wt Readings from Last 3 Encounters:  07/22/23 268 lb (121.6 kg)  06/11/23 264 lb (119.7 kg)  04/29/23 250 lb (113.4 kg)     Physical Exam Vitals reviewed.  Constitutional:      Appearance: He is well-developed.  HENT:     Head: Normocephalic  and atraumatic.     Right Ear: External ear normal.     Left Ear: External ear normal.     Mouth/Throat:     Pharynx: No oropharyngeal exudate or posterior oropharyngeal erythema.  Eyes:     Pupils: Pupils are equal, round, and reactive to light.  Cardiovascular:     Rate and Rhythm: Normal rate and regular rhythm.     Heart sounds: No murmur heard. Pulmonary:     Effort: No respiratory distress.     Breath sounds: Normal breath sounds.  Musculoskeletal:     Cervical back: Normal range of motion and neck supple.  Neurological:     Mental Status: He is alert and oriented to person, place, and time.      Assessment & Plan:  Uncontrolled type 2 diabetes mellitus with hyperglycemia (HCC)  Other orders -     Ozempic  (0.25 or 0.5 MG/DOSE); Inject 0.5 mg into the skin once a  week.  Dispense: 3 mL; Refill: 1 -     FreeStyle Libre 3 Sensor; Place 1 sensor on the skin every 14 days. Use to check glucose continuously  Dispense: 2 each; Refill: 11     Follow-up: Return in about 6 weeks (around 09/02/2023).  [name], M.D.

## 2023-07-25 ENCOUNTER — Telehealth: Payer: Self-pay | Admitting: Pharmacist

## 2023-07-25 ENCOUNTER — Telehealth (INDEPENDENT_AMBULATORY_CARE_PROVIDER_SITE_OTHER): Payer: Medicare (Managed Care) | Admitting: Pharmacist

## 2023-07-25 DIAGNOSIS — Z7984 Long term (current) use of oral hypoglycemic drugs: Secondary | ICD-10-CM

## 2023-07-25 DIAGNOSIS — E119 Type 2 diabetes mellitus without complications: Secondary | ICD-10-CM

## 2023-07-25 NOTE — Telephone Encounter (Signed)
 Can you route new Ozempic  0.5mg  weekly PAP to me Patient reports he had to cancel insurance/couldn't afford Will proceed with PAP Thank you!

## 2023-07-25 NOTE — Telephone Encounter (Signed)
 07/25/2023 Name: Steven Tanner MRN: [id number] DOB: [birth year]  Chief Complaint  Patient presents with   Medication Management    TNM T2DM    TADEN STILE is a 53 y.o. year old male who presented for a telephone visit.   They were referred to the pharmacist by their PCP for assistance in managing diabetes and medication access.     Subjective:  Patient living with T2DM and reports he does not currently have insurance.  He is interested in medication assistance for GLP1 (Ozempic ) therapy given A1c of 13.2%.  He reports compliance with current regimen, however needs additional control.   Care Team: Primary Care Provider: [name], MD    Medication Access/Adherence  Current Pharmacy:  [facility] [phone] - [place], [place] - [address] [place] [address] Phone: [phone] Fax: [phone]   Patient reports affordability concerns with their medications: Yes  Patient reports access/transportation concerns to their pharmacy: No  Patient reports adherence concerns with their medications:  Yes  due to cost   Diabetes:  Current medications: metformin , glimepiride  Medications tried in the past: n/a  Current glucose readings: n/a Using FSL3+ CGM prescribed (unable to afford)  Patient denies hypoglycemic s/sx including dizziness, shakiness, sweating. Patient denies hyperglycemic symptoms including polyuria, polydipsia, polyphagia, nocturia, neuropathy, blurred vision.  Current meal patterns:  Discussed meal planning options and Plate method for healthy eating Avoid sugary drinks and desserts Incorporate balanced protein, non starchy veggies, 1 serving of carbohydrate with each meal Increase water intake Increase physical activity as able  Current medication access support: none   Objective:  Lab Results  Component Value Date   HGBA1C 13.2 (H) 06/11/2023    Lab Results  Component Value Date   CREATININE 1.13 04/29/2023    BUN 17 04/29/2023   NA 136 04/29/2023   K 4.3 04/29/2023   CL 99 04/29/2023   CO2 16 (L) 04/29/2023    Lab Results  Component Value Date   CHOL 177 04/29/2023   HDL 28 (L) 04/29/2023   LDLCALC 64 04/29/2023   TRIG 553 (HH) 04/29/2023   CHOLHDL 6.3 (H) 04/29/2023    Medications Reviewed Today     Reviewed by Delilah Fend, [facility] (Pharmacist) on 07/25/23 at 1300  Med List Status: <None>   Medication Order Taking? Sig Documenting Provider Last Dose Status Informant  aspirin  EC 81 MG tablet [id number]  Take 1 tablet (81 mg total) by mouth daily. Laurann Pollock, MD  Active Self  atorvastatin  (LIPITOR) 40 MG tablet [id number]  Take 1 tablet (40 mg total) by mouth daily. [name], MD  Active   Continuous Glucose Sensor (FREESTYLE LIBRE 3 SENSOR) [place] [id number] No Place 1 sensor on the skin every 14 days. Use to check glucose continuously  Patient not taking: Reported on 07/25/2023   [name], MD Not Taking Active            Med Note Alpheus Arvin, Donata Fryer   Thu Jul 25, 2023  1:00 PM) Can't afford  gabapentin  (NEURONTIN ) 100 MG capsule [id number]  Take 1 capsule (100 mg total) by mouth 3 (three) times daily. Tommas Fragmin A, FNP  Active   glimepiride  (AMARYL ) 4 MG tablet [id number]  Take 1 tablet (4 mg total) by mouth daily before breakfast. [name], MD  Active   ibuprofen  (ADVIL ) 800 MG tablet [id number]  Take 1 tablet (800 mg total) by mouth every 8 (eight) hours as needed. Yevette Hem, FNP  Active   lisinopril  (ZESTRIL ) 10 MG tablet [id number]  Take 1 tablet (10 mg total) by mouth daily. To protect kidneys from diabetic damage [name], MD  Active   metFORMIN  (GLUCOPHAGE ) 1000 MG tablet [id number]  Take 1 tablet (1,000 mg total) by mouth 2 (two) times daily with a meal. [name], MD  Active   methocarbamol  (ROBAXIN ) 500 MG tablet [id number]  Take 1 tablet (500 mg total) by mouth 3 (three) times daily. Triplett, Tammy, PA-C  Active   Semaglutide ,0.25 or  0.5MG /DOS, (OZEMPIC , 0.25 OR 0.5 MG/DOSE,) 2 MG/3ML SOPN [id number] No Inject 0.5 mg into the skin once a week.  Patient not taking: Reported on 07/25/2023   [name], MD Not Taking Active            Med Note Alpheus Arvin, Haig Levan Jul 25, 2023  1:00 PM) Will apply for Novo Nordisk PAP-no insurance  sildenafil  (REVATIO ) 20 MG tablet [id number]  Take 2-5 tabs PRN prior to sexual activity [name], MD  Active              Assessment/Plan:   Diabetes: - Currently uncontrolled-decreased from 14-->13.2% from FEB to [facility] - Reviewed long term cardiovascular and renal outcomes of uncontrolled blood sugar - Reviewed goal A1c, goal fasting, and goal 2 hour post prandial glucose - Reviewed dietary modifications including FOLLOWING A HEART HEALTHY DIET/HEALTHY PLATE METHOD - Reviewed lifestyle modifications including:  - Recommend to :  Continue metformin  and glimepiride   Will start patient assistance application for ozempic  (no insurance) through novo nordisk PAP; may consider insulin if Glp1 not attainable or A1c doesn't respond to goal in appropriate time period   Continue statin and ACEi (lisinopril )  Continue all meds as prescribed by PCP - Patient denies personal or family history of multiple endocrine neoplasia type 2, medullary thyroid  cancer; personal history of pancreatitis or gallbladder disease. - Recommend to check glucose daily (fasting) or if symptomatic    Follow Up Plan: PharmD 08/01/23   Marvell Slider, PharmD, BCACP, CPP Clinical Pharmacist, [facility]

## 2023-07-26 ENCOUNTER — Other Ambulatory Visit (HOSPITAL_COMMUNITY): Payer: Self-pay

## 2023-07-26 ENCOUNTER — Encounter: Payer: Self-pay | Admitting: Pharmacist

## 2023-07-30 ENCOUNTER — Other Ambulatory Visit (HOSPITAL_COMMUNITY): Payer: Self-pay

## 2023-08-01 ENCOUNTER — Other Ambulatory Visit (INDEPENDENT_AMBULATORY_CARE_PROVIDER_SITE_OTHER): Payer: Medicare (Managed Care)

## 2023-08-01 DIAGNOSIS — Z7985 Long-term (current) use of injectable non-insulin antidiabetic drugs: Secondary | ICD-10-CM

## 2023-08-01 DIAGNOSIS — E1165 Type 2 diabetes mellitus with hyperglycemia: Secondary | ICD-10-CM

## 2023-08-01 DIAGNOSIS — E119 Type 2 diabetes mellitus without complications: Secondary | ICD-10-CM

## 2023-08-01 MED ORDER — LISINOPRIL 10 MG PO TABS: 10.0000 mg | ORAL_TABLET | Freq: Every day | ORAL | 3 refills | Status: DC

## 2023-08-01 MED ORDER — METFORMIN HCL 1000 MG PO TABS: 1000.0000 mg | ORAL_TABLET | Freq: Two times a day (BID) | ORAL | 3 refills | Status: DC

## 2023-08-01 MED ORDER — GLIMEPIRIDE 4 MG PO TABS: 4.0000 mg | ORAL_TABLET | Freq: Every day | ORAL | 1 refills | Status: DC

## 2023-08-01 NOTE — Progress Notes (Signed)
 08/01/2023 Name: Steven Tanner MRN: [id number] DOB: 22-Jun-1970  Chief Complaint  Patient presents with   Diabetes    Steven Tanner is a 53 y.o. year old male who presented for a telephone visit.   They were referred to the pharmacist by their PCP for assistance in managing diabetes and medication access.    Subjective:  Care Team: Primary Care Provider: [name], MD ; Next Scheduled Visit: 08/2023   Medication Access/Adherence  Current Pharmacy:  [facility] [phone] - [place] - [address] [place] [address] Phone: [phone] Fax: [phone]  Patient reports affordability concerns with their medications: Yes  Patient reports access/transportation concerns to their pharmacy: No  Patient reports adherence concerns with their medications:  No     Diabetes:  Current medications: new start ozempic , metformin , glimepiride  Medications tried in the past: n/a  Current glucose readings: FBG 167 Interested in CGM  Patient denies hypoglycemic s/sx including dizziness, shakiness, sweating. Patient denies hyperglycemic symptoms including polyuria, polydipsia, polyphagia, nocturia, neuropathy, blurred vision.  Current meal patterns:  Cut breads, sugary foods & drinks Increased water and activity Discussed meal planning options and Plate method for healthy eating Avoid sugary drinks and desserts Incorporate balanced protein, non starchy veggies, 1 serving of carbohydrate with each meal Increase water intake Increase physical activity as able  Current medication access support: applied for Novo nordisk PAP   Objective:  Lab Results  Component Value Date   HGBA1C 13.2 (H) 06/11/2023    Lab Results  Component Value Date   CREATININE 1.13 04/29/2023   BUN 17 04/29/2023   NA 136 04/29/2023   K 4.3 04/29/2023   CL 99 04/29/2023   CO2 16 (L) 04/29/2023    Lab Results  Component Value Date   CHOL 177 04/29/2023    HDL 28 (L) 04/29/2023   LDLCALC 64 04/29/2023   TRIG 553 (HH) 04/29/2023   CHOLHDL 6.3 (H) 04/29/2023    Medications Reviewed Today     Reviewed by Delilah Fend, [facility] (Pharmacist) on 08/01/23 at 1532  Med List Status: <None>   Medication Order Taking? Sig Documenting Provider Last Dose Status Informant  aspirin  EC 81 MG tablet [id number]  Take 1 tablet (81 mg total) by mouth daily. Laurann Pollock, MD  Active Self  atorvastatin  (LIPITOR) 40 MG tablet [id number]  Take 1 tablet (40 mg total) by mouth daily. [name], MD  Active   Continuous Glucose Sensor (FREESTYLE LIBRE 3 SENSOR) [place] [id number]  Place 1 sensor on the skin every 14 days. Use to check glucose continuously  Patient not taking: Reported on 07/25/2023   [name], MD  Active            Med Note Alpheus Arvin, Donata Fryer   Thu Jul 25, 2023  1:00 PM) Can't afford  gabapentin  (NEURONTIN ) 100 MG capsule [id number]  Take 1 capsule (100 mg total) by mouth 3 (three) times daily. Tommas Fragmin A, FNP  Active   glimepiride  (AMARYL ) 4 MG tablet [id number]  Take 1 tablet (4 mg total) by mouth daily before breakfast. [name], MD  Active   ibuprofen  (ADVIL ) 800 MG tablet [id number]  Take 1 tablet (800 mg total) by mouth every 8 (eight) hours as needed. Tommas Fragmin A, FNP  Active   lisinopril  (ZESTRIL ) 10 MG tablet [id number]  Take 1 tablet (10 mg total) by mouth daily. To protect kidneys from diabetic damage [name], MD  Active   metFORMIN  (  GLUCOPHAGE ) 1000 MG tablet [id number]  Take 1 tablet (1,000 mg total) by mouth 2 (two) times daily with a meal. [name], MD  Active   methocarbamol  (ROBAXIN ) 500 MG tablet [id number]  Take 1 tablet (500 mg total) by mouth 3 (three) times daily. Triplett, Tammy, PA-C  Active   Semaglutide ,0.25 or 0.5MG /DOS, (OZEMPIC , 0.25 OR 0.5 MG/DOSE,) 2 MG/3ML SOPN [id number] Yes Inject 0.5 mg into the skin once a week. [name], MD Taking Active            Med Note Alpheus Arvin, Donata Fryer   Thu Jul 25, 2023  1:00 PM) Will apply for Novo Nordisk PAP-no insurance  sildenafil  (REVATIO ) 20 MG tablet [id number]  Take 2-5 tabs PRN prior to sexual activity [name], MD  Active             Assessment/Plan:   Diabetes: - Currently uncontrolled, but patient reports improvement - Reviewed long term cardiovascular and renal outcomes of uncontrolled blood sugar - Reviewed goal A1c, goal fasting, and goal 2 hour post prandial glucose - Reviewed dietary modifications including ,heart healthy diet - Reviewed lifestyle modifications including:  - refills sent for lisinopril , glimepiride ,metformin   -libre 3 PLUS CGM left up front -Start Ozempic  02.5mg  weekly, increase as needed and as tolerated  PAP pending for Novo nordisk patient assistance - Patient denies personal or family history of multiple endocrine neoplasia type 2, medullary thyroid  cancer; personal history of pancreatitis or gallbladder disease. - Recommend to check glucose using CGM libre 3 Plus samples - Meets financial criteria for Ozempic  patient assistance program through novo nordisk. Will collaborate with provider, CPhT, and patient to pursue assistance.     Follow Up Plan: 1 month   Marvell Slider, PharmD, BCACP, CPP Clinical Pharmacist, [facility]

## 2023-08-09 ENCOUNTER — Telehealth: Payer: Self-pay

## 2023-08-09 NOTE — Progress Notes (Signed)
 Pharmacy Medication Assistance Program Note    08/29/2023  Patient ID: Steven Tanner, male   DOB: [birth year], 53 y.o.   MRN: [id number]     08/09/2023  Outreach Medication One  Manufacturer Medication One Novo Nordisk  Nordisk Drugs Ozempic   Type of Radiographer, therapeutic Assistance  Date Application Received From Patient 08/08/2023  Application Items Received From Patient Application  Date Application Received From Provider 08/08/2023  Date Application Submitted to Manufacturer 08/09/2023  Method Application Sent to Manufacturer Fax  Patient Assistance Determination Denied  Patient Denial Reasons Other   DENIED - PATIENT NOW HAS COMMERCIAL COVERAGE.

## 2023-08-14 ENCOUNTER — Telehealth: Payer: Self-pay | Admitting: Family Medicine

## 2023-08-14 NOTE — Telephone Encounter (Signed)
 Copied from CRM [id number]. Topic: Clinical - Prescription Issue >> Aug 14, 2023  1:47 PM Baldomero Bone wrote: Reason for CRM: [place] with novo nordisk patient assistance program calling regarding patient's application for Ozempic  is incomplete. Benefits verification is pending now that the NPI was provided for Dr Veleta Gerold. 24 to 48 business hours to complete. Callback number is [phone].

## 2023-08-22 NOTE — Telephone Encounter (Signed)
   Informed patient of sample of Ozempic  up front for pick up.  He will start with 0.25mg  weekly for 4 weeks then titrate up.  Patient assistance is still pending.   Malissie Musgrave Dattero Dionisio Aragones, PharmD, BCACP, CPP Clinical Pharmacist, [facility]

## 2023-08-29 ENCOUNTER — Other Ambulatory Visit (HOSPITAL_COMMUNITY): Payer: Self-pay

## 2023-08-29 NOTE — Telephone Encounter (Signed)
 Can we check status of this?

## 2023-08-29 NOTE — Telephone Encounter (Signed)
 Just seeing this! Routed the decision to you, he now has Nurse, learning disability.

## 2023-08-30 ENCOUNTER — Encounter: Payer: Self-pay | Admitting: Pharmacist

## 2023-09-02 ENCOUNTER — Ambulatory Visit: Payer: Self-pay | Admitting: Family Medicine

## 2023-09-02 ENCOUNTER — Telehealth: Payer: Self-pay | Admitting: Pharmacy Technician

## 2023-09-02 NOTE — Progress Notes (Signed)
   09/02/2023  Patient ID: Steven Tanner, male   DOB: 09/28/1970, 53 y.o.   MRN: [id number]  Patient engaged with clinical pharmacist for management of diabetes on 08/01/2023. Outreach by [employer] technician was requested.   Outreached patient to discuss diabetes medication management. Left voicemail for patient to return my call at their convenience.    Arlesia Kiel, CPhT [facility] Office: [phone] Email: [email or web address]

## 2023-09-04 ENCOUNTER — Telehealth: Payer: Self-pay | Admitting: Pharmacy Technician

## 2023-09-04 NOTE — Progress Notes (Signed)
   09/04/2023  Patient ID: Steven Tanner, male   DOB: 01/29/1971, 53 y.o.   MRN: [id number]  Patient engaged with clinical pharmacist for management of diabetes on 08/01/2023. Outreach by [employer] technician was requested.   2nd outreach o patient to discuss diabetes medication management. Left voicemail for patient to return my call at their convenience.    Heidi Lemay, CPhT [facility] Office: [phone] Email: [email or web address]

## 2023-09-05 ENCOUNTER — Other Ambulatory Visit (HOSPITAL_COMMUNITY): Payer: Self-pay

## 2023-09-05 ENCOUNTER — Telehealth: Payer: Self-pay | Admitting: Pharmacy Technician

## 2023-09-05 NOTE — Progress Notes (Signed)
   09/05/2023 Name: Steven Tanner MRN: [id number] DOB: 1970-09-07  Patient is appearing on a report for [employer] [place] Metric Diabetes and last engaged with the clinical pharmacist to discuss diabetes on 08/01/2023. Contacted patient today to discuss diabetes management and completed medication review.   Diabetes Plan from last clinical pharmacist appointment: Diabetes: - Currently uncontrolled, but patient reports improvement - Reviewed long term cardiovascular and renal outcomes of uncontrolled blood sugar - Reviewed goal A1c, goal fasting, and goal 2 hour post prandial glucose - Reviewed dietary modifications including ,heart healthy diet - Reviewed lifestyle modifications including:  - refills sent for lisinopril , glimepiride ,metformin   -libre 3 PLUS CGM left up front -Start Ozempic  02.5mg  weekly, increase as needed and as tolerated             PAP pending for Novo nordisk patient assistance - Patient denies personal or family history of multiple endocrine neoplasia type 2, medullary thyroid  cancer; personal history of pancreatitis or gallbladder disease. - Recommend to check glucose using CGM libre 3 Plus samples - Meets financial criteria for Ozempic  patient assistance program through novo nordisk. Will collaborate with provider, CPhT, and patient to pursue assistance.   -Follow Up Plan: 1 month    Medication Adherence Barriers Identified:  Patient made recommended medication changes per plan: Yes Patient informs he picked up the sample of Ozempic . He denies any nausea, vomiting, diarrhea and constipation. He informs he has had some sour burps but is still taking the medication. He also informs he is taking Glimiperide 4mg  every day and Metformin  100mg  bid. Per Dr Anson Basta, Glimiperide and Metformin  were last sold on 08/26/23 for 90 days supply.  Access issues with any new medication or testing device: Yes Ozempic  requires Prior Auth per test claim. Request sent to RX Prior Auth team and to  PharmD [employer]. Patient informs he was unable to get Jerrilyn Moras to work. He informs it worked for about an hour and then it was having trouble connecting so patient stopped using it. He did not try to use a different  (newly placed) sensor. Will inquire if PharmD has availability on 6/24 as patient has PCP appointment scheduled at 9:10am.  Patient is checking blood sugars as prescribed: No Patient informs he is checking blood sugar mainly once a day and occasionally twice a day before breakfast and supper. He informs he has been doing FSBS because is unable to get Jerrilyn Moras to work. He informs his blood sugars have been ranging between 118-180.No lows or highs reported. Ozempic  requires a Prior Auth per test claim ran by patient advocate team member. Request sent to RX Prior Auth Team via in basket. PAP denied due to patient having commercial insurance  Medication Adherence Barriers Addressed/Actions Taken:  Reviewed medication changes per plan from last clinical pharmacist note Medication Access for Ozempic  and Jerrilyn Moras Will discuss medication access concerns with pharmacist  Contacted pharmacy regarding new prescriptions Educated patient to contact pharmacy regarding new prescriptions   Reviewed instructions for monitoring blood sugars at home and reminded patient to keep a written log to review with pharmacist Reminded patient of date/time of upcoming clinical pharmacist follow up and any upcoming PCP/specialists visits. Patient denies transportation barriers to the appointment. Yes  Next clinical pharmacist appointment is scheduled for: TBD, will send in basket message to Care Guide Lenton Rail, RM and PharmD [employer].  Daliah Chaudoin, CPhT [facility] Office: [phone] Email: [email or web address]

## 2023-09-09 ENCOUNTER — Telehealth: Payer: Self-pay

## 2023-09-09 ENCOUNTER — Other Ambulatory Visit (HOSPITAL_COMMUNITY): Payer: Self-pay

## 2023-09-09 NOTE — Telephone Encounter (Signed)
 Pharmacy Patient Advocate Encounter   Received notification from Physician's Office that prior authorization for Ozempic  is required/requested.   Insurance verification completed.   The patient is insured through [employer] .   Per test claim: PA required; PA submitted to above mentioned insurance via Prompt PA Key/confirmation #/EOC [id number] Status is pending

## 2023-09-10 ENCOUNTER — Other Ambulatory Visit (HOSPITAL_COMMUNITY): Payer: Self-pay

## 2023-09-10 ENCOUNTER — Ambulatory Visit (INDEPENDENT_AMBULATORY_CARE_PROVIDER_SITE_OTHER): Payer: Self-pay | Admitting: Family Medicine

## 2023-09-10 ENCOUNTER — Encounter: Payer: Self-pay | Admitting: Family Medicine

## 2023-09-10 ENCOUNTER — Ambulatory Visit: Payer: Medicare (Managed Care)

## 2023-09-10 VITALS — BP 110/66 | HR 81 | Temp 98.1°F | Ht 70.0 in | Wt 249.0 lb

## 2023-09-10 DIAGNOSIS — E1165 Type 2 diabetes mellitus with hyperglycemia: Secondary | ICD-10-CM | POA: Diagnosis not present

## 2023-09-10 DIAGNOSIS — R5383 Other fatigue: Secondary | ICD-10-CM | POA: Diagnosis not present

## 2023-09-10 DIAGNOSIS — E782 Mixed hyperlipidemia: Secondary | ICD-10-CM

## 2023-09-10 LAB — BAYER DCA HB A1C WAIVED: HB A1C (BAYER DCA - WAIVED): 8.3 % — ABNORMAL HIGH (ref 4.8–5.6)

## 2023-09-10 LAB — LIPID PANEL

## 2023-09-10 NOTE — Progress Notes (Signed)
 Subjective:  Patient ID: Steven Tanner, male    DOB: Sep 20, 1970  Age: 53 y.o. MRN: [id number]  CC: Medical Management of Chronic Issues   HPI Steven Tanner presents forFollow-up of diabetes. Patient checks blood sugar at home.   120-140 fasting and not checking postprandial regularly. Shots controlling appetite. Working in the heat does too.  Patient denies symptoms such as polyuria, polydipsia, excessive hunger, nausea No significant hypoglycemic spells noted. Medications reviewed. Pt reports taking them regularly without complication/adverse reaction being reported today.    History Steven Tanner has a past medical history of Abscess, Diabetes (HCC), and GERD (gastroesophageal reflux disease).   He has a past surgical history that includes Incise and drain abcess; Pilonidal cyst excision (N/A, 11/02/2013); Colonoscopy with propofol  (N/A, 04/03/2022); and polypectomy (04/03/2022).   His family history includes Diabetes in his father; Heart disease in his father; Hypertension in his father and mother.He reports that he quit smoking about 6 years ago. His smoking use included cigarettes and cigars. He started smoking about 28 years ago. He has a 22.4 pack-year smoking history. He has never used smokeless tobacco. He reports current alcohol use. He reports that he does not currently use drugs after having used the following drugs: Marijuana.  Current Outpatient Medications on File Prior to Visit  Medication Sig Dispense Refill   aspirin  EC 81 MG tablet Take 1 tablet (81 mg total) by mouth daily. 90 tablet 3   atorvastatin  (LIPITOR) 40 MG tablet Take 1 tablet (40 mg total) by mouth daily. 90 tablet 3   gabapentin  (NEURONTIN ) 100 MG capsule Take 1 capsule (100 mg total) by mouth 3 (three) times daily. 90 capsule 0   glimepiride  (AMARYL ) 4 MG tablet Take 1 tablet (4 mg total) by mouth daily before breakfast. 90 tablet 1   ibuprofen  (ADVIL ) 800 MG tablet Take 1 tablet (800 mg total) by mouth every 8  (eight) hours as needed. 30 tablet 0   lisinopril  (ZESTRIL ) 10 MG tablet Take 1 tablet (10 mg total) by mouth daily. To protect kidneys from diabetic damage 90 tablet 3   metFORMIN  (GLUCOPHAGE ) 1000 MG tablet Take 1 tablet (1,000 mg total) by mouth 2 (two) times daily with a meal. 180 tablet 3   methocarbamol  (ROBAXIN ) 500 MG tablet Take 1 tablet (500 mg total) by mouth 3 (three) times daily. 21 tablet 0   Semaglutide ,0.25 or 0.5MG /DOS, (OZEMPIC , 0.25 OR 0.5 MG/DOSE,) 2 MG/3ML SOPN Inject 0.5 mg into the skin once a week. 3 mL 1   sildenafil  (REVATIO ) 20 MG tablet Take 2-5 tabs PRN prior to sexual activity 50 tablet 5   Continuous Glucose Sensor (FREESTYLE LIBRE 3 SENSOR) MISC Place 1 sensor on the skin every 14 days. Use to check glucose continuously (Patient not taking: Reported on 09/10/2023) 2 each 11   No current facility-administered medications on file prior to visit.    ROS Review of Systems  Constitutional:  Negative for fever.  Respiratory:  Negative for shortness of breath.   Cardiovascular:  Negative for chest pain.  Musculoskeletal:  Negative for arthralgias.  Skin:  Negative for rash.    Objective:  BP 110/66   Pulse 81   Temp 98.1 F (36.7 C)   Ht 5' 10 (1.778 m)   Wt 249 lb (112.9 kg)   SpO2 97%   BMI 35.73 kg/m   BP Readings from Last 3 Encounters:  09/10/23 110/66  07/22/23 118/69  06/11/23 127/76    Wt Readings from Last 3  Encounters:  09/10/23 249 lb (112.9 kg)  07/22/23 268 lb (121.6 kg)  06/11/23 264 lb (119.7 kg)    Lab Results  Component Value Date   HGBA1C 8.3 (H) 09/10/2023   HGBA1C 13.2 (H) 06/11/2023   HGBA1C 14.0 (H) 04/29/2023    Physical Exam Vitals reviewed.  Constitutional:      Appearance: He is well-developed.  HENT:     Head: Normocephalic and atraumatic.     Right Ear: External ear normal.     Left Ear: External ear normal.     Mouth/Throat:     Pharynx: No oropharyngeal exudate or posterior oropharyngeal erythema.    Eyes:     Pupils: Pupils are equal, round, and reactive to light.    Cardiovascular:     Rate and Rhythm: Normal rate and regular rhythm.     Heart sounds: No murmur heard. Pulmonary:     Effort: No respiratory distress.     Breath sounds: Normal breath sounds.   Musculoskeletal:     Cervical back: Normal range of motion and neck supple.   Neurological:     Mental Status: He is alert and oriented to person, place, and time.        Assessment & Plan:  Uncontrolled type 2 diabetes mellitus with hyperglycemia (HCC) -     Microalbumin / creatinine urine ratio -     Bayer DCA Hb A1c Waived  Mixed hyperlipidemia -     CMP14+EGFR -     Lipid panel  Fatigue, unspecified type -     CBC with Differential/Platelet   A1c improved dramatically from 13 to 8.3. Pt. Nearing goal. Encouraged to continue efforts at improving exercise and diet. Continue weight loss efforts as well   Follow-up: Return in about 3 months (around 12/11/2023) for diabetes.  Butler Der, M.D.

## 2023-09-11 LAB — CBC WITH DIFFERENTIAL/PLATELET
Basophils Absolute: 0 10*3/uL (ref 0.0–0.2)
Basos: 1 %
EOS (ABSOLUTE): 0.1 10*3/uL (ref 0.0–0.4)
Eos: 2 %
Hematocrit: 45.9 % (ref 37.5–51.0)
Hemoglobin: 14.4 g/dL (ref 13.0–17.7)
Immature Grans (Abs): 0 10*3/uL (ref 0.0–0.1)
Immature Granulocytes: 0 %
Lymphocytes Absolute: 2 10*3/uL (ref 0.7–3.1)
Lymphs: 36 %
MCH: 26.9 pg (ref 26.6–33.0)
MCHC: 31.4 g/dL — ABNORMAL LOW (ref 31.5–35.7)
MCV: 86 fL (ref 79–97)
Monocytes Absolute: 0.6 10*3/uL (ref 0.1–0.9)
Monocytes: 11 %
Neutrophils Absolute: 2.9 10*3/uL (ref 1.4–7.0)
Neutrophils: 50 %
Platelets: 335 10*3/uL (ref 150–450)
RBC: 5.35 x10E6/uL (ref 4.14–5.80)
RDW: 14.1 % (ref 11.6–15.4)
WBC: 5.7 10*3/uL (ref 3.4–10.8)

## 2023-09-11 LAB — CMP14+EGFR
ALT: 31 IU/L (ref 0–44)
AST: 23 IU/L (ref 0–40)
Albumin: 4.6 g/dL (ref 3.8–4.9)
Alkaline Phosphatase: 77 IU/L (ref 44–121)
BUN/Creatinine Ratio: 14 (ref 9–20)
BUN: 18 mg/dL (ref 6–24)
Bilirubin Total: 0.4 mg/dL (ref 0.0–1.2)
CO2: 19 mmol/L — ABNORMAL LOW (ref 20–29)
Calcium: 9.6 mg/dL (ref 8.7–10.2)
Chloride: 101 mmol/L (ref 96–106)
Creatinine, Ser: 1.33 mg/dL — ABNORMAL HIGH (ref 0.76–1.27)
Globulin, Total: 2.4 g/dL (ref 1.5–4.5)
Glucose: 187 mg/dL — ABNORMAL HIGH (ref 70–99)
Potassium: 4.9 mmol/L (ref 3.5–5.2)
Sodium: 137 mmol/L (ref 134–144)
Total Protein: 7 g/dL (ref 6.0–8.5)
eGFR: 65 mL/min/{1.73_m2} (ref >59)

## 2023-09-11 LAB — MICROALBUMIN / CREATININE URINE RATIO
Creatinine, Urine: 312.1 mg/dL
Microalb/Creat Ratio: 12 mg/g{creat} (ref 0–29)
Microalbumin, Urine: 38.5 ug/mL

## 2023-09-11 LAB — LIPID PANEL
Cholesterol, Total: 152 mg/dL (ref 100–199)
HDL: 30 mg/dL — ABNORMAL LOW (ref >39)
LDL CALC COMMENT:: 5.1 ratio — ABNORMAL HIGH (ref 0.0–5.0)
LDL Chol Calc (NIH): 98 mg/dL (ref 0–99)
Triglycerides: 135 mg/dL (ref 0–149)
VLDL Cholesterol Cal: 24 mg/dL (ref 5–40)

## 2023-09-12 ENCOUNTER — Other Ambulatory Visit (HOSPITAL_COMMUNITY): Payer: Self-pay

## 2023-09-12 ENCOUNTER — Telehealth: Payer: Self-pay | Admitting: Pharmacy Technician

## 2023-09-12 NOTE — Telephone Encounter (Signed)
 Pharmacy Patient Advocate Encounter  Received notification from [employer] that Prior Authorization for Ozempic  has been APPROVED from 09/09/2023 to 09/07/2024   PA #/Case ID/Reference #: [id number]

## 2023-09-12 NOTE — Progress Notes (Signed)
   09/12/2023 Name: Steven Tanner MRN: [id number] DOB: 29-Aug-1970  Patient is appearing on a report for [employer] [place] Metric Diabetes and last engaged with the clinical pharmacist to discuss diabetes on 08/01/2023. Contacted patient today to discuss diabetes management and completed medication review.   Diabetes Plan from last clinical pharmacist appointment:  Diabetes: - Currently uncontrolled, but patient reports improvement - Reviewed long term cardiovascular and renal outcomes of uncontrolled blood sugar - Reviewed goal A1c, goal fasting, and goal 2 hour post prandial glucose - Reviewed dietary modifications including ,heart healthy diet - Reviewed lifestyle modifications including:  - refills sent for lisinopril , glimepiride ,metformin   -libre 3 PLUS CGM left up front -Start Ozempic  02.5mg  weekly, increase as needed and as tolerated             PAP pending for Novo nordisk patient assistance - Patient denies personal or family history of multiple endocrine neoplasia type 2, medullary thyroid  cancer; personal history of pancreatitis or gallbladder disease. - Recommend to check glucose using CGM libre 3 Plus samples - Meets financial criteria for Ozempic  patient assistance program through novo nordisk. Will collaborate with provider, CPhT, and patient to pursue assistance. (copy/paste from last note)    Medication Adherence Barriers Addressed/Actions Taken:  Reviewed medication changes per plan from last clinical pharmacist note Reviewed instructions for monitoring blood sugars at home and reminded patient to keep a written log to review with pharmacist Reminded patient of date/time of upcoming clinical pharmacist follow up and any upcoming PCP/specialists visits. Patient denies transportation barriers to the appointment. Yes    Patient was called and informed of the PA approval with his insurance for Ozzempic and he was also informs his preferred pharmacy would be getting the medication  ready for him to pick up at $0.00 copay.  Next clinical pharmacist appointment is scheduled for: 09/26/2023  Verlene Glantz, CPhT [facility] Office: [phone] Email: [email or web address]

## 2023-09-17 ENCOUNTER — Ambulatory Visit: Payer: Self-pay | Admitting: Family Medicine

## 2023-09-17 NOTE — Progress Notes (Signed)
Hello Steven Tanner,  Your lab result is normal and/or stable.Some minor variations that are not significant are commonly marked abnormal, but do not represent any medical problem for you.  Best regards, S[place] Moss, M.D.

## 2023-09-26 ENCOUNTER — Ambulatory Visit (INDEPENDENT_AMBULATORY_CARE_PROVIDER_SITE_OTHER): Payer: Medicare (Managed Care) | Admitting: Pharmacist

## 2023-09-26 DIAGNOSIS — E119 Type 2 diabetes mellitus without complications: Secondary | ICD-10-CM

## 2023-09-26 DIAGNOSIS — Z7985 Long-term (current) use of injectable non-insulin antidiabetic drugs: Secondary | ICD-10-CM

## 2023-09-26 NOTE — Progress Notes (Signed)
 09/26/2023 Name: Steven Tanner MRN: [id number] DOB: Nov 05, 1970  Chief Complaint  Patient presents with   Diabetes    Steven Tanner is a 53 y.o. year old male who was referred for medication management by their primary care provider, Zollie Lowers, MD. They presented for a face to face visit today.   They were referred to the pharmacist by their PCP for assistance in managing diabetes and medication access    Subjective:  Care Team: Primary Care Provider: Zollie Lowers, MD ; Next Scheduled Visit: 11/2023   Medication Access/Adherence  [facility]acy:  [phone] [place] - [address] [place] [address] Phone: [phone] Fax: [phone]   Patient reports affordability concerns with their medications: Yes  Patient reports access/transportation concerns to their pharmacy: No  Patient reports adherence concerns with their medications:  No     Diabetes:  Current medications: ozempic  increased to 1mg  weekly, metformin , glimepiride  Medications tried in the past: n/a   Current glucose readings: FBG 150 Libre 3 plus CGM applied today (sample)--not covered under insurance due to not being on insulin   Patient denies hypoglycemic s/sx including dizziness, shakiness, sweating. Patient denies hyperglycemic symptoms including polyuria, polydipsia, polyphagia, nocturia, neuropathy, blurred vision.   Current meal patterns:  Cut breads, sugary foods & drinks Increased water and activity Discussed meal planning options and Plate method for healthy eating Avoid sugary drinks and desserts Incorporate balanced protein, non starchy veggies, 1 serving of carbohydrate with each meal Increase water intake Increase physical activity as able   Current medication access support: still has commercial insurance so can't apply for Novo nordisk PAP; switching jobs so won't have insurance for 3 months    Objective:  Lab Results   Component Value Date   HGBA1C 8.3 (H) 09/10/2023    Lab Results  Component Value Date   CREATININE 1.33 (H) 09/10/2023   BUN 18 09/10/2023   NA 137 09/10/2023   K 4.9 09/10/2023   CL 101 09/10/2023   CO2 19 (L) 09/10/2023    Lab Results  Component Value Date   CHOL 152 09/10/2023   HDL 30 (L) 09/10/2023   LDLCALC 98 09/10/2023   TRIG 135 09/10/2023   CHOLHDL 5.1 (H) 09/10/2023    Medications Reviewed Today     Reviewed by Billee Mliss JONETTA, [facility] (Pharmacist) on 09/26/23 at 1250  Med List Status: <None>   Medication Order Taking? Sig Documenting Provider Last Dose Status Informant  aspirin  EC 81 MG tablet [id number]  Take 1 tablet (81 mg total) by mouth daily. Alvan Dorn FALCON, MD  Active Self  atorvastatin  (LIPITOR) 40 MG tablet [id number]  Take 1 tablet (40 mg total) by mouth daily. Zollie Lowers, MD  Active   Continuous Glucose Sensor (FREESTYLE LIBRE 3 [place]) [place] [id number] Yes Place 1 sensor on the skin every 14 days. Use to check glucose continuously Zollie Lowers, MD  Active            Med Note TENA, Mattilynn Forrer D   Thu Jul 25, 2023  1:00 PM) Can't afford  gabapentin  (NEURONTIN ) 100 MG capsule [id number]  Take 1 capsule (100 mg total) by mouth 3 (three) times daily. Lavell Lye A, FNP  Active   glimepiride  (AMARYL ) 4 MG tablet [id number]  Take 1 tablet (4 mg total) by mouth daily before breakfast. Zollie Lowers, MD  Active   ibuprofen  (ADVIL ) 800 MG tablet [id number]  Take 1 tablet (800 mg total) by mouth  every 8 (eight) hours as needed. Lavell Lye A, FNP  Active   lisinopril  (ZESTRIL ) 10 MG tablet [id number]  Take 1 tablet (10 mg total) by mouth daily. To protect kidneys from diabetic damage Zollie Lowers, MD  Active   metFORMIN  (GLUCOPHAGE ) 1000 MG tablet [id number]  Take 1 tablet (1,000 mg total) by mouth 2 (two) times daily with a meal. Zollie Lowers, MD  Active   methocarbamol  (ROBAXIN ) 500 MG tablet [id number]  Take 1 tablet (500 mg total) by mouth 3 (three) times  daily. Triplett, Tammy, PA-C  Active   OZEMPIC , 1 MG/DOSE, 4 MG/3ML SOPN [id number] Yes Inject 1 mg into the skin once a week. [provider]  Active     Discontinued 09/26/23 1250 (Change in therapy)            Med Note (Kumari Sculley D   Thu Jul 25, 2023  1:00 PM) Will apply for Novo Nordisk PAP-no insurance  sildenafil  (REVATIO ) 20 MG tablet [id number]  Take 2-5 tabs PRN prior to sexual activity Zollie Lowers, MD  Active              Assessment/Plan:   Diabetes: - A1c improved from 13.2 --> 8.3%; will continue ozempic  1 mg for now, consider increase to 2mg  as able with insurance/tolerability - Reviewed long term cardiovascular and renal outcomes of uncontrolled blood sugar - Reviewed goal A1c, goal fasting, and goal 2 hour post prandial glucose - Recommend to continue all medications as prescribed -Libre 3 PLUS CGM applied and education given to patient (using app on the phone)  - Patient denies personal or family history of multiple endocrine neoplasia type 2, medullary thyroid  cancer; personal history of pancreatitis or gallbladder disease. -Will be in between insurances; encouraged patient to reach out and we can assist with samples as able.  Hyperlipidemia: LDL 98, goal<70 Encouraged statin compliance, patient to restart  Will assess at f/u   Follow Up Plan: 1 month  Mliss Tarry Griffin, PharmD, BCACP, CPP Clinical Pharmacist, [facility]

## 2023-10-18 ENCOUNTER — Other Ambulatory Visit

## 2023-10-18 DIAGNOSIS — E119 Type 2 diabetes mellitus without complications: Secondary | ICD-10-CM

## 2023-10-18 DIAGNOSIS — Z7985 Long-term (current) use of injectable non-insulin antidiabetic drugs: Secondary | ICD-10-CM

## 2023-10-18 NOTE — Progress Notes (Signed)
 10/18/2023 Name: Steven Tanner MRN: [id number] DOB: January 17, 1971 Chief Complaint  Patient presents with   Diabetes    Steven Tanner is a 53 y.o. year old male who presented for a telephone visit.  I connected with  [name] on 10/30/23 by telephone and verified that I am speaking with the correct person using two identifiers. I discussed the limitations of evaluation and management by telemedicine. The patient expressed understanding and agreed to proceed.  Patient was located in her home and PharmD in PCP office during this visit.   They were referred to the pharmacist by their PCP for assistance in managing diabetes and medication access.    Subjective:  Care Team: Primary Care Provider: Zollie Lowers, MD ; Next Scheduled Visit:    Medication Access/Adherence  [phone] [place] - [address] [place] [address] Phone: [phone] Fax: [phone]  Patient reports affordability concerns with their medications: Yes  Patient reports access/transportation concerns to their pharmacy: No  Patient reports adherence concerns with their medications:  Yes  due to cost/uninsured   Diabetes:  Current medications: ozempic  1mg -->increasing to 2mg ,metformin , glimepiride  Medications tried in the past: n/a  Current glucose readings: 120-140, up to 180 PPBG Using accuchek meter; testing occasionally   Patient denies hypoglycemic s/sx including dizziness, shakiness, sweating. Patient denies hyperglycemic symptoms including polyuria, polydipsia, polyphagia, nocturia, neuropathy, blurred vision.  Current meal patterns:  Discussed meal planning options and Plate method for healthy eating Avoid sugary drinks and desserts Incorporate balanced protein, non starchy veggies, 1 serving of carbohydrate with each meal Increase water intake Increase physical activity as able  Current physical activity: states he does  yard work, however needs to increase daily physical activity  Current medication access support: currently no insurance (in between jobs)   Objective:  Lab Results  Component Value Date   HGBA1C 8.3 (H) 09/10/2023    Lab Results  Component Value Date   CREATININE 1.33 (H) 09/10/2023   BUN 18 09/10/2023   NA 137 09/10/2023   K 4.9 09/10/2023   CL 101 09/10/2023   CO2 19 (L) 09/10/2023    Lab Results  Component Value Date   CHOL 152 09/10/2023   HDL 30 (L) 09/10/2023   LDLCALC 98 09/10/2023   TRIG 135 09/10/2023   CHOLHDL 5.1 (H) 09/10/2023    Medications Reviewed Today     Reviewed by Billee Mliss JONETTA, [facility] (Pharmacist) on 10/30/23 at 2224  Med List Status: <None>   Medication Order Taking? Sig Documenting Provider Last Dose Status Informant  aspirin  EC 81 MG tablet [id number]  Take 1 tablet (81 mg total) by mouth daily. Alvan Dorn FALCON, MD  Active Self  atorvastatin  (LIPITOR) 40 MG tablet [id number]  Take 1 tablet (40 mg total) by mouth daily. Zollie Lowers, MD  Active   Continuous Glucose Sensor (FREESTYLE LIBRE 3 SENSOR) [place] [id number]  Place 1 sensor on the skin every 14 days. Use to check glucose continuously Zollie Lowers, MD  Active            Med Note TENA, Laira Penninger D   Thu Jul 25, 2023  1:00 PM) Can't afford  gabapentin  (NEURONTIN ) 100 MG capsule [id number]  TAKE 1 CAPSULE (100 MG TOTAL) BY MOUTH THREE TIMES DAILY. Lavell Lye A, FNP  Active   glimepiride  (AMARYL ) 4 MG tablet [id number]  Take 1 tablet (4 mg total) by mouth daily before breakfast. Zollie Lowers, MD  Active   ibuprofen  (ADVIL ) 800 MG tablet [id number]  Take 1 tablet (800 mg total) by mouth every 8 (eight) hours as needed. Lavell Lye A, FNP  Active   lisinopril  (ZESTRIL ) 10 MG tablet [id number]  Take 1 tablet (10 mg total) by mouth daily. To protect kidneys from diabetic damage Zollie Lowers, MD  Active   metFORMIN  (GLUCOPHAGE ) 1000 MG tablet [id number]  Take 1 tablet (1,000 mg total) by mouth 2  (two) times daily with a meal. Zollie Lowers, MD  Active   methocarbamol  (ROBAXIN ) 500 MG tablet [id number]  Take 1 tablet (500 mg total) by mouth 3 (three) times daily. Triplett, Tammy, PA-C  Active   OZEMPIC , 1 MG/DOSE, 4 MG/3ML SOPN [id number]  Inject 1 mg into the skin once a week. [provider]  Active   sildenafil  (REVATIO ) 20 MG tablet [id number]  TAKE 2-5 TABS AS NEEDED PRIOR TO SEXUAL ACTIVITY Zollie Lowers, MD  Active               Assessment/Plan:   Diabetes: - A1c improved from 13.2 --> 8.3%; will continue ozempic  1 mg for now Increase to 2mg  weekly as able with tolerability (sample provided) Continue metformin  & glimepiride  for now - Reviewed long term cardiovascular and renal outcomes of uncontrolled blood sugar - Reviewed goal A1c, goal fasting, and goal 2 hour post prandial glucose - Recommend to continue all medications as prescribed - Patient denies personal or family history of multiple endocrine neoplasia type 2, medullary thyroid  cancer; personal history of pancreatitis or gallbladder disease. -Will be in between insurances; encouraged patient to reach out and we can assist with samples as able.   Hyperlipidemia: LDL 98, goal<70 Encouraged statin compliance, patient to restart  Will assess at f/u     Follow Up Plan: 1 month - Recommend to check glucose daily (fasting) or if symptomatic     Follow Up Plan: PCP in 1 month  Mliss Tarry Griffin, PharmD, BCACP, CPP Clinical Pharmacist, [facility]

## 2023-10-29 ENCOUNTER — Other Ambulatory Visit: Payer: Self-pay | Admitting: Family

## 2023-10-29 ENCOUNTER — Other Ambulatory Visit: Payer: Self-pay | Admitting: Family Medicine

## 2023-10-29 DIAGNOSIS — M5412 Radiculopathy, cervical region: Secondary | ICD-10-CM

## 2023-10-29 DIAGNOSIS — N5203 Combined arterial insufficiency and corporo-venous occlusive erectile dysfunction: Secondary | ICD-10-CM

## 2023-11-13 ENCOUNTER — Encounter: Payer: Self-pay | Admitting: Pharmacist

## 2023-11-13 ENCOUNTER — Other Ambulatory Visit (INDEPENDENT_AMBULATORY_CARE_PROVIDER_SITE_OTHER)

## 2023-11-13 DIAGNOSIS — Z7985 Long-term (current) use of injectable non-insulin antidiabetic drugs: Secondary | ICD-10-CM

## 2023-11-13 DIAGNOSIS — E119 Type 2 diabetes mellitus without complications: Secondary | ICD-10-CM

## 2023-11-13 NOTE — Progress Notes (Signed)
 11/13/2023 Name: Steven Tanner MRN: [id number] DOB: [birth year] Chief Complaint  Patient presents with   Diabetes    Steven Tanner is a 53 y.o. year old male who presented for a telephone visit.  I connected with  Steven Tanner on 11/13/23 by telephone and verified that I am speaking with the correct person using two identifiers. I discussed the limitations of evaluation and management by telemedicine. The patient expressed understanding and agreed to proceed.  Patient was located in her home and PharmD in PCP office during this visit.   They were referred to the pharmacist by their PCP for assistance in managing diabetes and medication access.    Subjective:  Care Team: Primary Care Provider: Zollie Lowers, MD ; Next Scheduled Visit:    Medication Access/Adherence  [facility]  [phone] [place] - [address] [place] [address] Phone: [phone] Fax: [phone]  Patient reports affordability concerns with their medications: Yes  Patient reports access/transportation concerns to their pharmacy: No  Patient reports adherence concerns with their medications:  Yes  due to cost/uninsured   Diabetes:  Current medications: Ozempic  1mg -->increasing to 2mg ,metformin , glimepiride  Medications tried in the past: n/a  Current glucose readings: 120-140, up to 180 PPBG Using accuchek meter; testing occasionally   Patient denies hypoglycemic s/sx including dizziness, shakiness, sweating. Patient denies hyperglycemic symptoms including polyuria, polydipsia, polyphagia, nocturia, neuropathy, blurred vision.  Current meal patterns:  Discussed meal planning options and Plate method for healthy eating Avoid sugary drinks and desserts Incorporate balanced protein, non starchy veggies, 1 serving of carbohydrate with each meal Increase water intake Increase physical activity as able  Current physical activity: states he  does yard work, however needs to increase daily physical activity  Current medication access support: currently no insurance (in between jobs)   Objective:  Lab Results  Component Value Date   HGBA1C 8.3 (H) 09/10/2023    Lab Results  Component Value Date   CREATININE 1.33 (H) 09/10/2023   BUN 18 09/10/2023   NA 137 09/10/2023   K 4.9 09/10/2023   CL 101 09/10/2023   CO2 19 (L) 09/10/2023    Lab Results  Component Value Date   CHOL 152 09/10/2023   HDL 30 (L) 09/10/2023   LDLCALC 98 09/10/2023   TRIG 135 09/10/2023   CHOLHDL 5.1 (H) 09/10/2023    Medications Reviewed Today     Reviewed by Billee Mliss JONETTA, [facility] (Pharmacist) on 11/22/23 at 1412  Med List Status: <None>   Medication Order Taking? Sig Documenting Provider Last Dose Status Informant  aspirin  EC 81 MG tablet [id number]  Take 1 tablet (81 mg total) by mouth daily. Alvan Dorn FALCON, MD  Active Self  atorvastatin  (LIPITOR) 40 MG tablet [id number]  Take 1 tablet (40 mg total) by mouth daily. Zollie Lowers, MD  Active   Continuous Glucose Sensor (FREESTYLE LIBRE 3 SENSOR) [place] [id number]  Place 1 sensor on the skin every 14 days. Use to check glucose continuously Zollie Lowers, MD  Active            Med Note TENA, Cayci Mcnabb D   Thu Jul 25, 2023  1:00 PM) Can't afford  gabapentin  (NEURONTIN ) 100 MG capsule [id number]  TAKE 1 CAPSULE (100 MG TOTAL) BY MOUTH THREE TIMES DAILY. Lavell Lye A, FNP  Active   glimepiride  (AMARYL ) 4 MG tablet [id number]  Take 1 tablet (4 mg total) by mouth daily before breakfast. Zollie Lowers, MD  Active   ibuprofen  (ADVIL ) 800 MG tablet [id number]  Take 1 tablet (800 mg total) by mouth every 8 (eight) hours as needed. Lavell Lye A, FNP  Active   lisinopril  (ZESTRIL ) 10 MG tablet [id number]  Take 1 tablet (10 mg total) by mouth daily. To protect kidneys from diabetic damage Zollie Lowers, MD  Active   metFORMIN  (GLUCOPHAGE ) 1000 MG tablet [id number]  Take 1 tablet (1,000 mg total) by  mouth 2 (two) times daily with a meal. Zollie Lowers, MD  Active   methocarbamol  (ROBAXIN ) 500 MG tablet [id number]  Take 1 tablet (500 mg total) by mouth 3 (three) times daily. Triplett, Tammy, PA-C  Active   OZEMPIC , 1 MG/DOSE, 4 MG/3ML SOPN [id number]  Inject 1 mg into the skin once a week. [provider]  Active   sildenafil  (REVATIO ) 20 MG tablet [id number]  TAKE 2-5 TABS AS NEEDED PRIOR TO SEXUAL ACTIVITY Zollie Lowers, MD  Active              Assessment/Plan:   Diabetes: - A1c improved from 13.2 --> 8.3%; will continue ozempic  1 mg for now Increase to 2mg  weekly as able with tolerability (sample provided) Continue metformin  & glimepiride  for now - Reviewed long term cardiovascular and renal outcomes of uncontrolled blood sugar - Reviewed goal A1c, goal fasting, and goal 2 hour post prandial glucose - Recommend to continue all medications as prescribed - Patient denies personal or family history of multiple endocrine neoplasia type 2, medullary thyroid  cancer; personal history of pancreatitis or gallbladder disease. -Will be in between insurances; encouraged patient to reach out and we can assist with samples as able.   Hyperlipidemia: LDL 98, goal<70 Encouraged statin compliance Will assess at f/u  Encourage compliance with lisinopril      Follow Up Plan: 1 month - Recommend to check glucose daily (fasting) or if symptomatic     Follow Up Plan: PCP in 1 month  Mliss Tarry Griffin, PharmD, BCACP, CPP Clinical Pharmacist, [facility]

## 2023-11-22 ENCOUNTER — Telehealth: Payer: Self-pay | Admitting: Family Medicine

## 2023-11-22 NOTE — Telephone Encounter (Unsigned)
 Copied from CRM #[id number]. Topic: General - Other >> Nov 22, 2023  2:15 PM Donna E wrote: Reason for CRM: patient returning call from [place],

## 2023-11-22 NOTE — Telephone Encounter (Signed)
 Copied from CRM #[id number]. Topic: General - Other >> Nov 22, 2023  2:15 PM Donna E wrote: Reason for CRM: patient returning call from [place],

## 2023-12-11 ENCOUNTER — Ambulatory Visit (INDEPENDENT_AMBULATORY_CARE_PROVIDER_SITE_OTHER): Payer: Medicare (Managed Care) | Admitting: Family Medicine

## 2023-12-11 ENCOUNTER — Ambulatory Visit (INDEPENDENT_AMBULATORY_CARE_PROVIDER_SITE_OTHER): Payer: Self-pay

## 2023-12-11 ENCOUNTER — Encounter: Payer: Self-pay | Admitting: Family Medicine

## 2023-12-11 VITALS — BP 98/65 | HR 87 | Temp 98.2°F | Ht 70.0 in | Wt 238.0 lb

## 2023-12-11 DIAGNOSIS — E119 Type 2 diabetes mellitus without complications: Secondary | ICD-10-CM

## 2023-12-11 DIAGNOSIS — M25551 Pain in right hip: Secondary | ICD-10-CM

## 2023-12-11 DIAGNOSIS — Z125 Encounter for screening for malignant neoplasm of prostate: Secondary | ICD-10-CM

## 2023-12-11 DIAGNOSIS — E782 Mixed hyperlipidemia: Secondary | ICD-10-CM

## 2023-12-11 DIAGNOSIS — Z7985 Long-term (current) use of injectable non-insulin antidiabetic drugs: Secondary | ICD-10-CM

## 2023-12-11 LAB — BAYER DCA HB A1C WAIVED: HB A1C (BAYER DCA - WAIVED): 6.8 % — ABNORMAL HIGH (ref 4.8–5.6)

## 2023-12-11 MED ORDER — NABUMETONE 500 MG PO TABS: 1000.0000 mg | ORAL_TABLET | Freq: Two times a day (BID) | ORAL | 1 refills | Status: AC

## 2023-12-11 NOTE — Progress Notes (Unsigned)
 Subjective:  Patient ID: Steven Tanner, male    DOB: 12-15-1970  Age: 53 y.o. MRN: [id number]  CC: Medical Management of Chronic Issues   HPI  Discussed the use of AI scribe software for clinical note transcription with the patient, who gave verbal consent to proceed.  History of Present Illness Steven Tanner is a 53 year old male with diabetes who presents with right hip pain.  He has significant right hip pain that has progressively worsened over the last couple of months. The pain radiates to his groin and knee and is described as constant and severe; he reports taking ibuprofen  800 mg approximately every six hours to manage the pain. The pain affects his mobility, causing a limp and stiffness, especially after sitting for a period of time.  He has a history of diabetes and has been managing his blood sugar levels. His blood sugar has been low, leading him to stop taking metformin  and glimepiride . He is currently on Ozempic  and has experienced significant weight loss, losing 30 pounds since May. He does not report any side effects from Ozempic , although he questions if his hip pain could be related to his medications.  He recently changed jobs and is currently working at [employer] as a Naval architect. He is in a waiting period for his new insurance to begin, which will start in about six weeks. His blood sugar readings can vary significantly, sometimes dropping to 66 after starting work, despite being 120 before leaving home.          12/11/2023    9:54 AM 04/29/2023    8:33 AM 12/20/2022   11:41 AM  Depression screen PHQ 2/9  Decreased Interest 0 1 1  Down, Depressed, Hopeless 0 0 0  PHQ - 2 Score 0 1 1  Altered sleeping  1 1  Tired, decreased energy  1 1  Change in appetite  1 1  Feeling bad or failure about yourself   0 0  Trouble concentrating  1 1  Moving slowly or fidgety/restless  0 0  Suicidal thoughts  0 0  PHQ-9 Score  5 5  Difficult doing work/chores   Not difficult at all Not difficult at all    History Jaegar has a past medical history of Abscess, Diabetes (HCC), and GERD (gastroesophageal reflux disease).   He has a past surgical history that includes Incise and drain abcess; Pilonidal cyst excision (N/A, 11/02/2013); Colonoscopy with propofol  (N/A, 04/03/2022); and polypectomy (04/03/2022).   His family history includes Diabetes in his father; Heart disease in his father; Hypertension in his father and mother.He reports that he quit smoking about 6 years ago. His smoking use included cigarettes and cigars. He started smoking about 28 years ago. He has a 22.4 pack-year smoking history. He has never used smokeless tobacco. He reports current alcohol use. He reports that he does not currently use drugs after having used the following drugs: Marijuana.    ROS Review of Systems  Constitutional: Negative.   HENT: Negative.    Eyes:  Negative for visual disturbance.  Respiratory:  Negative for cough and shortness of breath.   Cardiovascular:  Negative for chest pain and leg swelling.  Gastrointestinal:  Negative for abdominal pain, diarrhea, nausea and vomiting.  Genitourinary:  Negative for difficulty urinating.  Musculoskeletal:  Negative for arthralgias and myalgias.  Skin:  Negative for rash.  Neurological:  Negative for headaches.  Psychiatric/Behavioral:  Negative for sleep disturbance.     Objective:  BP 98/65   Pulse 87   Temp 98.2 F (36.8 C)   Ht 5' 10 (1.778 m)   Wt 238 lb (108 kg)   SpO2 98%   BMI 34.15 kg/m   BP Readings from Last 3 Encounters:  12/11/23 98/65  09/10/23 110/66  07/22/23 118/69    Wt Readings from Last 3 Encounters:  12/11/23 238 lb (108 kg)  09/10/23 249 lb (112.9 kg)  07/22/23 268 lb (121.6 kg)     Physical Exam  Physical Exam GENERAL: Alert, cooperative, well developed, no acute distress. HEENT: Normocephalic, normal oropharynx, moist mucous membranes. CHEST: Clear to auscultation  bilaterally, no wheezes, rhonchi, or crackles. CARDIOVASCULAR: Normal heart rate and rhythm, S1 and S2 normal without murmurs. ABDOMEN: Soft, non-tender, non-distended, without organomegaly, normal bowel sounds. EXTREMITIES: No cyanosis or edema. NEUROLOGICAL: Cranial nerves grossly intact, moves all extremities without gross motor or sensory deficit.   Assessment & Plan:  Right hip pain -     DG HIP UNILAT W OR W/O PELVIS 2-3 VIEWS RIGHT; Future  Screening for prostate cancer -     PSA, total and free  Diabetes mellitus treated with injections of non-insulin medication (HCC) -     CBC with Differential/Platelet -     CMP14+EGFR -     Bayer DCA Hb A1c Waived -     Vitamin B12  Mixed hyperlipidemia -     Lipid panel  Other orders -     Nabumetone ; Take 2 tablets (1,000 mg total) by mouth 2 (two) times daily. 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 Symptoms have worsened over the past months, causing significant discomfort and mobility issues, including limping and stiffness after sitting. Pain is managed with ibuprofen  800 mg every six hours, though there is concern about nephrotoxic effects. Arthritis is considered in the differential diagnosis, but current medications, including Ozempic , are unlikely contributors. Order an x-ray of the right hip and discuss alternative pain management options to ibuprofen .  Type 2 diabetes mellitus   He has type 2 diabetes mellitus with a recent weight loss of 30 pounds since May. Blood glucose levels have decreased, leading to the discontinuation of glimepiride  and metformin . Diabetes is currently managed with Ozempic , which is not expected to cause hypoglycemia. Blood glucose occasionally drops to 66 mg/dL, especially after  physical activity, but no hypoglycemia symptoms are reported. Monitor A1c levels to assess diabetes management, continue Ozempic , and discontinue glimepiride  and metformin .       Follow-up: No follow-ups on file.  Butler Der, M.D.

## 2023-12-12 LAB — CBC WITH DIFFERENTIAL/PLATELET
Basophils Absolute: 0 x10E3/uL (ref 0.0–0.2)
Basos: 1 %
EOS (ABSOLUTE): 0.1 x10E3/uL (ref 0.0–0.4)
Eos: 2 %
Hematocrit: 45.1 % (ref 37.5–51.0)
Hemoglobin: 14.5 g/dL (ref 13.0–17.7)
Immature Grans (Abs): 0 x10E3/uL (ref 0.0–0.1)
Immature Granulocytes: 0 %
Lymphocytes Absolute: 2.4 x10E3/uL (ref 0.7–3.1)
Lymphs: 33 %
MCH: 28.2 pg (ref 26.6–33.0)
MCHC: 32.2 g/dL (ref 31.5–35.7)
MCV: 88 fL (ref 79–97)
Monocytes Absolute: 0.7 x10E3/uL (ref 0.1–0.9)
Monocytes: 10 %
Neutrophils Absolute: 4.1 x10E3/uL (ref 1.4–7.0)
Neutrophils: 54 %
Platelets: 340 x10E3/uL (ref 150–450)
RBC: 5.15 x10E6/uL (ref 4.14–5.80)
RDW: 13.8 % (ref 11.6–15.4)
WBC: 7.4 x10E3/uL (ref 3.4–10.8)

## 2023-12-12 LAB — LIPID PANEL
Chol/HDL Ratio: 4.3 ratio (ref 0.0–5.0)
Cholesterol, Total: 142 mg/dL (ref 100–199)
HDL: 33 mg/dL — ABNORMAL LOW (ref >39)
LDL Chol Calc (NIH): 96 mg/dL (ref 0–99)
Triglycerides: 64 mg/dL (ref 0–149)
VLDL Cholesterol Cal: 13 mg/dL (ref 5–40)

## 2023-12-12 LAB — CMP14+EGFR
ALT: 20 IU/L (ref 0–44)
AST: 19 IU/L (ref 0–40)
Albumin: 4.4 g/dL (ref 3.8–4.9)
Alkaline Phosphatase: 74 IU/L (ref 47–123)
BUN/Creatinine Ratio: 10 (ref 9–20)
BUN: 10 mg/dL (ref 6–24)
Bilirubin Total: 0.5 mg/dL (ref 0.0–1.2)
CO2: 19 mmol/L — ABNORMAL LOW (ref 20–29)
Calcium: 9.8 mg/dL (ref 8.7–10.2)
Chloride: 107 mmol/L — ABNORMAL HIGH (ref 96–106)
Creatinine, Ser: 1.03 mg/dL (ref 0.76–1.27)
Globulin, Total: 2.5 g/dL (ref 1.5–4.5)
Glucose: 121 mg/dL — ABNORMAL HIGH (ref 70–99)
Potassium: 4.6 mmol/L (ref 3.5–5.2)
Sodium: 140 mmol/L (ref 134–144)
Total Protein: 6.9 g/dL (ref 6.0–8.5)
eGFR: 88 mL/min/1.73 (ref >59)

## 2023-12-12 LAB — PSA, TOTAL AND FREE
PSA, Free Pct: 31.8 %
PSA, Free: 0.35 ng/mL
Prostate Specific Ag, Serum: 1.1 ng/mL (ref 0.0–4.0)

## 2023-12-12 LAB — VITAMIN B12: Vitamin B-12: 573 pg/mL (ref 232–1245)

## 2023-12-13 ENCOUNTER — Ambulatory Visit: Payer: Self-pay | Admitting: Family Medicine

## 2023-12-13 NOTE — Progress Notes (Signed)
Hello Steven Tanner,  Your lab result is normal and/or stable.Some minor variations that are not significant are commonly marked abnormal, but do not represent any medical problem for you.  Best regards, S[place] Moss, M.D.

## 2024-01-06 ENCOUNTER — Other Ambulatory Visit: Payer: Self-pay | Admitting: Family Medicine

## 2024-01-06 MED ORDER — OZEMPIC (1 MG/DOSE) 4 MG/3ML ~~LOC~~ SOPN: 1.0000 mg | PEN_INJECTOR | SUBCUTANEOUS | 1 refills | Status: DC

## 2024-01-06 NOTE — Telephone Encounter (Signed)
 Copied from CRM [id number]. Topic: Clinical - Medication Refill >> Jan 06, 2024  8:21 AM Marda MATSU wrote: Medication: OZEMPIC , 1 MG/DOSE, 4 MG/3ML SOPN [[id number]]  Has the patient contacted their pharmacy? No (Agent: If no, request that the patient contact the pharmacy for the refill. If patient does not wish to contact the pharmacy document the reason why and proceed with request.) (Agent: If yes, when and what did the pharmacy advise?)   Patient picks up medication at the office      Is the patient out of the medication? Yes  Has the patient been seen for an appointment in the last year OR does the patient have an upcoming appointment? Yes  Can we respond through MyChart? No  Agent: Please be advised that Rx refills may take up to 3 business days. We ask that you follow-up with your pharmacy.

## 2024-01-08 ENCOUNTER — Telehealth: Payer: Self-pay

## 2024-01-08 NOTE — Telephone Encounter (Signed)
 Copied from CRM #[id number]. Topic: General - Other >> Jan 08, 2024  1:13 PM [name] wrote: Reason for CRM: Patient requesting to speak to Mliss Griffin, patient is wanting to discuss his ozempic .   Requesting callback: [phone]

## 2024-01-09 ENCOUNTER — Telehealth: Payer: Self-pay | Admitting: Family Medicine

## 2024-01-09 NOTE — Telephone Encounter (Signed)
 Routed to [place]

## 2024-01-09 NOTE — Telephone Encounter (Signed)
 Copied from CRM #[id number]. Topic: General - Other >> Jan 08, 2024  1:13 PM [name] wrote: Reason for CRM: Patient requesting to speak to Mliss Griffin, patient is wanting to discuss his ozempic .   Requesting callback: [phone] >> Jan 09, 2024 12:13 PM Montie POUR wrote: Steven Tanner is calling back and he needs to speak with Mliss Griffin about his ozempic . He has been out of the medication for 2 weeks. He wants to talk about the program that Ms. Pruitt had him on since he does not have insurance. Please call him back at [phone]. Thanks

## 2024-01-10 ENCOUNTER — Encounter: Payer: Self-pay | Admitting: *Deleted

## 2024-01-13 ENCOUNTER — Telehealth: Payer: Self-pay | Admitting: Family Medicine

## 2024-01-13 ENCOUNTER — Other Ambulatory Visit (HOSPITAL_COMMUNITY): Payer: Self-pay

## 2024-01-13 NOTE — Telephone Encounter (Signed)
 Copied from CRM #[id number]. Topic: General - Other >> Jan 08, 2024  1:13 PM [name] wrote: Reason for CRM: Patient requesting to speak to Mliss Griffin, patient is wanting to discuss his ozempic .   Requesting callback: [phone] >> Jan 13, 2024 10:39 AM Zebedee SAUNDERS wrote: Pt calling regarding ozempic  that Mliss Griffin prescribed. He has been out of the medication for 2 weeks. He wants to talk about the program that Ms. Pruitt had him on since he does not have insurance. Please call pt at [phone]. >> Jan 09, 2024 12:13 PM Montie POUR wrote: Sekai is calling back and he needs to speak with Mliss Griffin about his ozempic . He has been out of the medication for 2 weeks. He wants to talk about the program that Ms. Pruitt had him on since he does not have insurance. Please call him back at [phone]. Thanks

## 2024-01-14 NOTE — Telephone Encounter (Signed)
 Left message requesting call back regarding Novo Nordisk PAP.

## 2024-01-14 NOTE — Telephone Encounter (Unsigned)
 Copied from CRM #[id number]. Topic: General - Other >> Jan 08, 2024  1:13 PM [name] wrote: Reason for CRM: Patient requesting to speak to Mliss Griffin, patient is wanting to discuss his ozempic .   Requesting callback: [phone] >> Jan 14, 2024  1:29 PM Larissa RAMAN wrote: Patient is requesting a callback from Mliss Griffin, pharmacist.  >> Jan 13, 2024 10:39 AM Zebedee SAUNDERS wrote: Pt calling regarding ozempic  that Mliss Griffin prescribed. He has been out of the medication for 2 weeks. He wants to talk about the program that Ms. Pruitt had him on since he does not have insurance. Please call pt at [phone]. >> Jan 09, 2024 12:13 PM Montie POUR wrote: Steven Tanner is calling back and he needs to speak with Mliss Griffin about his ozempic . He has been out of the medication for 2 weeks. He wants to talk about the program that Ms. Pruitt had him on since he does not have insurance. Please call him back at [phone]. Thanks

## 2024-01-15 ENCOUNTER — Telehealth: Payer: Self-pay | Admitting: Family Medicine

## 2024-01-15 NOTE — Telephone Encounter (Signed)
 Pt came in checking to see if we had any ozempic  samples. I read message to pt about his program is ending. He said he has been out for a couple of weeks. He doesn't have insurance to pay for prescription. Told him that I would have Amber call to make appt but he wants to know what to take until then

## 2024-01-15 NOTE — Telephone Encounter (Signed)
 Pt needs to schedule appt with Steven Tanner since his program is ending for ozempic 

## 2024-01-15 NOTE — Telephone Encounter (Signed)
 Pt. Was Called. Will pick up a sample

## 2024-01-15 NOTE — Telephone Encounter (Signed)
 Left message 01/14/24 requesting call back.

## 2024-01-17 NOTE — Telephone Encounter (Signed)
 Correct Ozempic  sample not available. Spoke with Dr Museum/gallery Exhibitions Officer and he advised to give patient the sample and to take 0.5mg  weekly. Sample picked up by Parker Dawn and she voiced understanding.

## 2024-01-20 ENCOUNTER — Telehealth: Payer: Self-pay

## 2024-01-20 NOTE — Progress Notes (Signed)
 Complex Care Management Care Guide Note  01/20/2024 Name: Steven Tanner MRN: [id number] DOB: [birth year]  Steven Tanner is a 53 y.o. year old male who is a primary care patient of [name], Butler, MD and is actively engaged with the care management team. I reached out to [name] by phone today to assist with scheduling  with the Pharmacist.  Follow up plan: Unsuccessful telephone outreach attempt made. A HIPAA compliant phone message was left for the patient providing contact information and requesting a return call.  Jeoffrey Buffalo , RMA     [facility]  [facility], [facility] Guide  Direct Dial: [phone]  Website: [email or web address]

## 2024-01-23 ENCOUNTER — Other Ambulatory Visit: Payer: Self-pay

## 2024-01-23 ENCOUNTER — Other Ambulatory Visit (HOSPITAL_COMMUNITY): Payer: Self-pay

## 2024-01-23 ENCOUNTER — Other Ambulatory Visit (HOSPITAL_BASED_OUTPATIENT_CLINIC_OR_DEPARTMENT_OTHER): Payer: Self-pay

## 2024-01-23 ENCOUNTER — Telehealth: Payer: Self-pay | Admitting: Pharmacist

## 2024-01-23 DIAGNOSIS — E1165 Type 2 diabetes mellitus with hyperglycemia: Secondary | ICD-10-CM

## 2024-01-23 DIAGNOSIS — E782 Mixed hyperlipidemia: Secondary | ICD-10-CM

## 2024-01-23 MED ORDER — LISINOPRIL 10 MG PO TABS
10.0000 mg | ORAL_TABLET | Freq: Every day | ORAL | 3 refills | Status: DC
  Filled 2024-01-23 (×2): qty 90, 90d supply, fill #0

## 2024-01-23 MED ORDER — ATORVASTATIN CALCIUM 40 MG PO TABS
40.0000 mg | ORAL_TABLET | Freq: Every day | ORAL | 3 refills | Status: DC
  Filled 2024-01-23: qty 90, 90d supply, fill #0

## 2024-01-23 NOTE — Telephone Encounter (Signed)
 Please reach out to patient to schedule in person visit with PHarmD.  He will be enrolled in the [place] Liberate clinical trial and have CGM placed.  Dewitte Tarry Griffin, PharmD, BCACP, CPP Clinical Pharmacist, [facility]

## 2024-01-23 NOTE — Progress Notes (Signed)
 01/23/2024 Name: Steven Tanner MRN: [id number] DOB: 12-27-1970  Chief Complaint  Patient presents with   Diabetes    Medication access support    Steven Tanner is a 53 y.o. year old male who presented for a telephone visit.   They were referred to the pharmacist by their PCP for assistance in managing diabetes and medication access.    Subjective:  Care Team: Primary Care Provider: Zollie Lowers, MD ; Next Scheduled Visit: 03/09/2024  Medication Access/Adherence  Current Pharmacy:  [facility] [phone] - [place], [place] - [address] [place] [address] Phone: [phone] Fax: [phone]  [facility] - [facility] [address] [place] [address] Phone: [phone] Fax: [phone]   Patient reports affordability concerns with their medications: Yes  Patient reports access/transportation concerns to their pharmacy: No  Patient reports adherence concerns with their medications:  Yes  Was unsure if still needed to take lisinopril    Diabetes:  Current medications: Ozempic  0.5 mg Medications tried in the past: Ozempic  1 mg, metformin , glimepiride , Januvia , pioglitazone   Current glucose readings: Not available  Patient denies hypoglycemic s/sx including dizziness, shakiness, sweating. Patient denies hyperglycemic symptoms including polyuria, polydipsia, polyphagia, nocturia, neuropathy, blurred vision.  Current meal patterns: not discussed at this visit  Current physical activity: not discussed at this visit  Current medication access support: enrollment in Ozempic  patient assistance program needed  Macrovascular and Microvascular Risk Reduction:  Statin? yes (Not taking); ACEi/ARB? yes (Not taking) Last urinary albumin/creatinine ratio:  Lab Results  Component Value Date   MICRALBCREAT 12 09/10/2023   MICRALBCREAT 13 06/05/2022   MICRALBCREAT 20 07/01/2019   MICRALBCREAT 11.9 10/09/2017    MICRALBCREAT 3.3 11/06/2016   MICRALBCREAT 3.5 08/06/2016   MICRALBCREAT 15.2 04/24/2016   Last eye exam:  Lab Results  Component Value Date   HMDIABEYEEXA No Retinopathy 01/31/2023   Last foot exam: 06/11/2023 Tobacco Use:  Tobacco Use: Medium Risk (12/11/2023)   Patient History    Smoking Tobacco Use: Former    Smokeless Tobacco Use: Never    Passive Exposure: Not on file    Objective:  Lab Results  Component Value Date   HGBA1C 6.8 (H) 12/11/2023    Lab Results  Component Value Date   CREATININE 1.03 12/11/2023   BUN 10 12/11/2023   NA 140 12/11/2023   K 4.6 12/11/2023   CL 107 (H) 12/11/2023   CO2 19 (L) 12/11/2023    Lab Results  Component Value Date   CHOL 142 12/11/2023   HDL 33 (L) 12/11/2023   LDLCALC 96 12/11/2023   TRIG 64 12/11/2023   CHOLHDL 4.3 12/11/2023    Medications Reviewed Today     Reviewed by Gaile Powell HERO, RPH (Pharmacist) on 01/23/24 at 1451  Med List Status: <None>   Medication Order Taking? Sig Documenting Provider Last Dose Status Informant  aspirin  EC 81 MG tablet [id number]  Take 1 tablet (81 mg total) by mouth daily. Alvan Dorn FALCON, MD  Active Self  atorvastatin  (LIPITOR) 40 MG tablet [id number]  Take 1 tablet (40 mg total) by mouth daily.  Patient not taking: Reported on 01/23/2024   Zollie Lowers, MD  Active   Continuous Glucose Sensor (FREESTYLE LIBRE 3 [place]) [place] [id number]  Place 1 sensor on the skin every 14 days. Use to check glucose continuously Zollie Lowers, MD  Active            Med Note (Ernesto Zukowski D  Thu Jul 25, 2023  1:00 PM) Can't afford  gabapentin  (NEURONTIN ) 100 MG capsule [id number]  TAKE 1 CAPSULE (100 MG TOTAL) BY MOUTH THREE TIMES DAILY. Lavell Lye A, FNP  Active   lisinopril  (ZESTRIL ) 10 MG tablet [id number]  Take 1 tablet (10 mg total) by mouth daily. To protect kidneys from diabetic damage  Patient not taking: Reported on 01/23/2024   Zollie Lowers, MD  Active   methocarbamol  (ROBAXIN ) 500 MG  tablet [id number]  Take 1 tablet (500 mg total) by mouth 3 (three) times daily. Triplett, Tammy, PA-C  Active   nabumetone  (RELAFEN ) 500 MG tablet [id number]  Take 2 tablets (1,000 mg total) by mouth 2 (two) times daily. For muscle and joint pain Zollie Lowers, MD  Active   OZEMPIC , 1 MG/DOSE, 4 MG/3ML SOPN [id number]  Inject 1 mg into the skin once a week.  Patient taking differently: Inject 0.5 mg into the skin once a week.   Zollie Lowers, MD  Active   sildenafil  (REVATIO ) 20 MG tablet [id number]  TAKE 2-5 TABS AS NEEDED PRIOR TO SEXUAL ACTIVITY Zollie Lowers, MD  Active             Assessment/Plan:   Diabetes: - Currently controlled; goal A1c <7%. Cardiorenal risk reduction is opportunities for improvement.. Blood pressure is at goal <130/80. LDL is not at goal.  - Reviewed long term cardiovascular and renal outcomes of uncontrolled blood sugar. - Recommend to continue Ozempic  0.5 mg weekly. - Discussed change in dose due to limited samples. - Discussed enrollment in LIBERATE trial to provide free Freestyle Libre 3  Medication Access - Currently uninsured - Patient can enroll in insurance at 90 days of employment, plans to discuss insurance options with employer today and reach out to [facility] office to see if qualifies - Discussed Ozempic  patient assistance program enrollment, patient provided income and household size information - Sample available for patient pick up at [facility], patient plans to pick up  Follow Up Plan: Phone visit with pharmacist in 1 month to assess medication access progress. Will schedule patient at a convenient time to enroll in LIBERATE.    Powell Gallus, PharmD, MPH Pharmacy Resident   Mliss Tarry Griffin, PharmD, BCACP, CPP Clinical Pharmacist, [facility]

## 2024-01-24 ENCOUNTER — Other Ambulatory Visit: Payer: Self-pay

## 2024-01-29 ENCOUNTER — Other Ambulatory Visit: Payer: Self-pay

## 2024-02-20 ENCOUNTER — Ambulatory Visit: Payer: Self-pay

## 2024-02-20 ENCOUNTER — Encounter: Payer: Self-pay | Admitting: Pharmacist

## 2024-03-09 ENCOUNTER — Ambulatory Visit: Payer: Self-pay | Admitting: Family Medicine

## 2024-03-09 ENCOUNTER — Telehealth: Payer: Self-pay | Admitting: Family Medicine

## 2024-03-09 NOTE — Telephone Encounter (Signed)
 Copied from CRM #[id number]. Topic: Appointments - Appointment Cancel/Reschedule >> Mar 09, 2024  9:06 AM Marda MATSU wrote: Patient stated it is very important that he get a work excuse letter. He is needing it for work. Showing proof that the office cancelled his appointment and that he did not.  Appointment was cancelled because per Dr. Zollie nurse pt was too early to be seen. Please put in MyChart or Email  it or call him.    Please advise.

## 2024-03-09 NOTE — Telephone Encounter (Signed)
 Sent via MyChart. LS

## 2024-03-16 ENCOUNTER — Ambulatory Visit: Payer: Self-pay | Admitting: Family Medicine

## 2024-04-21 ENCOUNTER — Other Ambulatory Visit (HOSPITAL_COMMUNITY): Payer: Self-pay

## 2024-04-21 ENCOUNTER — Telehealth: Payer: Self-pay

## 2024-04-21 ENCOUNTER — Encounter: Payer: Self-pay | Admitting: Family Medicine

## 2024-04-21 ENCOUNTER — Ambulatory Visit: Payer: Self-pay | Admitting: Family Medicine

## 2024-04-21 DIAGNOSIS — E782 Mixed hyperlipidemia: Secondary | ICD-10-CM | POA: Diagnosis not present

## 2024-04-21 DIAGNOSIS — N5203 Combined arterial insufficiency and corporo-venous occlusive erectile dysfunction: Secondary | ICD-10-CM | POA: Diagnosis not present

## 2024-04-21 DIAGNOSIS — M5412 Radiculopathy, cervical region: Secondary | ICD-10-CM | POA: Diagnosis not present

## 2024-04-21 DIAGNOSIS — Z7985 Long-term (current) use of injectable non-insulin antidiabetic drugs: Secondary | ICD-10-CM

## 2024-04-21 DIAGNOSIS — E1165 Type 2 diabetes mellitus with hyperglycemia: Secondary | ICD-10-CM

## 2024-04-21 LAB — BAYER DCA HB A1C WAIVED: HB A1C (BAYER DCA - WAIVED): 6.7 % — ABNORMAL HIGH (ref 4.8–5.6)

## 2024-04-21 MED ORDER — METHOCARBAMOL 500 MG PO TABS: 500.0000 mg | ORAL_TABLET | Freq: Three times a day (TID) | ORAL | 0 refills | Status: AC

## 2024-04-21 MED ORDER — OZEMPIC (1 MG/DOSE) 4 MG/3ML ~~LOC~~ SOPN: 1.0000 mg | PEN_INJECTOR | SUBCUTANEOUS | 1 refills | Status: AC

## 2024-04-21 MED ORDER — SILDENAFIL CITRATE 20 MG PO TABS: ORAL_TABLET | ORAL | 5 refills | Status: AC

## 2024-04-21 MED ORDER — PREGABALIN 50 MG PO CAPS: ORAL_CAPSULE | ORAL | 1 refills | Status: AC

## 2024-04-21 MED ORDER — LISINOPRIL 10 MG PO TABS: 10.0000 mg | ORAL_TABLET | Freq: Every day | ORAL | 3 refills | Status: AC

## 2024-04-21 MED ORDER — ATORVASTATIN CALCIUM 40 MG PO TABS: 40.0000 mg | ORAL_TABLET | Freq: Every day | ORAL | 3 refills | Status: AC

## 2024-04-22 ENCOUNTER — Ambulatory Visit: Payer: Self-pay | Admitting: Family Medicine

## 2024-04-22 LAB — CMP14+EGFR
ALT: 28 [IU]/L (ref 0–44)
AST: 18 [IU]/L (ref 0–40)
Albumin: 4.4 g/dL (ref 3.8–4.9)
Alkaline Phosphatase: 86 [IU]/L (ref 47–123)
BUN/Creatinine Ratio: 13 (ref 9–20)
BUN: 13 mg/dL (ref 6–24)
Bilirubin Total: 0.4 mg/dL (ref 0.0–1.2)
CO2: 21 mmol/L (ref 20–29)
Calcium: 9.5 mg/dL (ref 8.7–10.2)
Chloride: 104 mmol/L (ref 96–106)
Creatinine, Ser: 1.02 mg/dL (ref 0.76–1.27)
Globulin, Total: 2.7 g/dL (ref 1.5–4.5)
Glucose: 147 mg/dL — ABNORMAL HIGH (ref 70–99)
Potassium: 4.8 mmol/L (ref 3.5–5.2)
Sodium: 139 mmol/L (ref 134–144)
Total Protein: 7.1 g/dL (ref 6.0–8.5)
eGFR: 89 mL/min/{1.73_m2}

## 2024-04-22 LAB — LIPID PANEL
Chol/HDL Ratio: 5.3 ratio — ABNORMAL HIGH (ref 0.0–5.0)
Cholesterol, Total: 222 mg/dL — ABNORMAL HIGH (ref 100–199)
HDL: 42 mg/dL
LDL Chol Calc (NIH): 166 mg/dL — ABNORMAL HIGH (ref 0–99)
Triglycerides: 80 mg/dL (ref 0–149)
VLDL Cholesterol Cal: 14 mg/dL (ref 5–40)

## 2024-04-22 LAB — CBC WITH DIFFERENTIAL/PLATELET
Basophils Absolute: 0 10*3/uL (ref 0.0–0.2)
Basos: 1 %
EOS (ABSOLUTE): 0.1 10*3/uL (ref 0.0–0.4)
Eos: 2 %
Hematocrit: 45.8 % (ref 37.5–51.0)
Hemoglobin: 15.1 g/dL (ref 13.0–17.7)
Immature Grans (Abs): 0 10*3/uL (ref 0.0–0.1)
Immature Granulocytes: 0 %
Lymphocytes Absolute: 2.7 10*3/uL (ref 0.7–3.1)
Lymphs: 35 %
MCH: 28.3 pg (ref 26.6–33.0)
MCHC: 33 g/dL (ref 31.5–35.7)
MCV: 86 fL (ref 79–97)
Monocytes Absolute: 0.7 10*3/uL (ref 0.1–0.9)
Monocytes: 9 %
Neutrophils Absolute: 4.3 10*3/uL (ref 1.4–7.0)
Neutrophils: 53 %
Platelets: 317 10*3/uL (ref 150–450)
RBC: 5.33 x10E6/uL (ref 4.14–5.80)
RDW: 12.5 % (ref 11.6–15.4)
WBC: 7.9 10*3/uL (ref 3.4–10.8)

## 2024-05-21 ENCOUNTER — Ambulatory Visit: Admitting: Family Medicine
